# Patient Record
Sex: Female | Born: 1990 | Race: White | Hispanic: No | Marital: Married | Smoking: Never smoker
Health system: Southern US, Community
[De-identification: ages and names within clinical notes are randomized; demographics above are authoritative.]

## PROBLEM LIST (undated history)

## (undated) ENCOUNTER — Inpatient Hospital Stay: Payer: Self-pay

## (undated) DIAGNOSIS — Z862 Personal history of diseases of the blood and blood-forming organs and certain disorders involving the immune mechanism: Secondary | ICD-10-CM

## (undated) DIAGNOSIS — Z8719 Personal history of other diseases of the digestive system: Secondary | ICD-10-CM

## (undated) DIAGNOSIS — O26899 Other specified pregnancy related conditions, unspecified trimester: Secondary | ICD-10-CM

## (undated) DIAGNOSIS — O2212 Genital varices in pregnancy, second trimester: Secondary | ICD-10-CM

## (undated) DIAGNOSIS — Z3490 Encounter for supervision of normal pregnancy, unspecified, unspecified trimester: Secondary | ICD-10-CM

## (undated) DIAGNOSIS — K589 Irritable bowel syndrome without diarrhea: Secondary | ICD-10-CM

## (undated) DIAGNOSIS — Z8619 Personal history of other infectious and parasitic diseases: Secondary | ICD-10-CM

## (undated) DIAGNOSIS — R110 Nausea: Secondary | ICD-10-CM

## (undated) HISTORY — DX: Irritable bowel syndrome, unspecified: K58.9

## (undated) HISTORY — PX: TONSILLECTOMY: SUR1361

## (undated) HISTORY — DX: Personal history of other diseases of the digestive system: Z87.19

## (undated) HISTORY — DX: Encounter for supervision of normal pregnancy, unspecified, unspecified trimester: Z34.90

## (undated) HISTORY — DX: Other specified pregnancy related conditions, unspecified trimester: O26.899

## (undated) HISTORY — DX: Personal history of other infectious and parasitic diseases: Z86.19

## (undated) HISTORY — PX: LAPAROSCOPY: SHX197

## (undated) HISTORY — DX: Genital varices in pregnancy, second trimester: O22.12

## (undated) HISTORY — DX: Personal history of diseases of the blood and blood-forming organs and certain disorders involving the immune mechanism: Z86.2

## (undated) HISTORY — DX: Nausea: R11.0

---
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
## 2010-02-08 ENCOUNTER — Other Ambulatory Visit: Payer: Self-pay

## 2010-02-13 ENCOUNTER — Emergency Department: Payer: Self-pay | Admitting: Emergency Medicine

## 2010-07-15 ENCOUNTER — Emergency Department: Payer: Self-pay | Admitting: Emergency Medicine

## 2010-08-01 ENCOUNTER — Observation Stay: Payer: Self-pay

## 2010-09-08 ENCOUNTER — Observation Stay: Payer: Self-pay

## 2010-09-21 ENCOUNTER — Observation Stay: Payer: Self-pay

## 2010-09-22 ENCOUNTER — Inpatient Hospital Stay: Payer: Self-pay | Admitting: Obstetrics & Gynecology

## 2011-02-16 IMAGING — US US OB < 14 WEEKS - US OB TV
1 series · 17 of 28 positions shown · non-contrast
Comparison: none

REASON FOR EXAM: 1ST TRIMESTER VAG BLEED
COMMENTS:

PROCEDURE:     US  - US OB LESS THAN 14 WEEKS/W TRANS  - February 14, 2010  [DATE]
RESULT:
TECHNIQUE: Transabdominal and endovaginal emergent early OB ultrasound is
performed. Grayscale and color Doppler images were obtained.
There is no previous study for comparison.

[Series 1: us ob < 14 weeks - us ob tv · 17 of 77 slices shown]
[im 1/77]
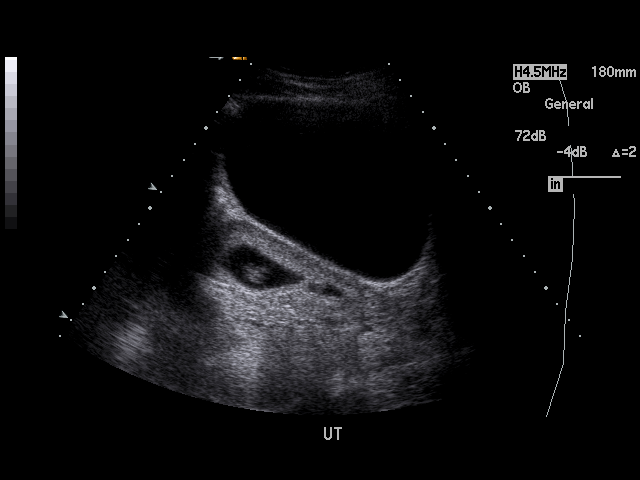
[im 6/77]
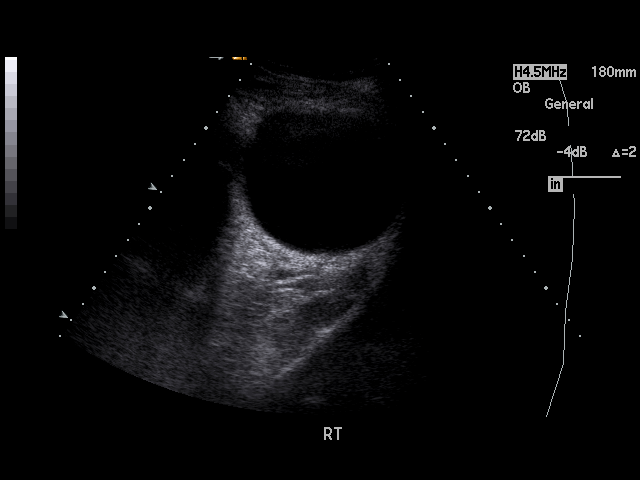
[im 12/77]
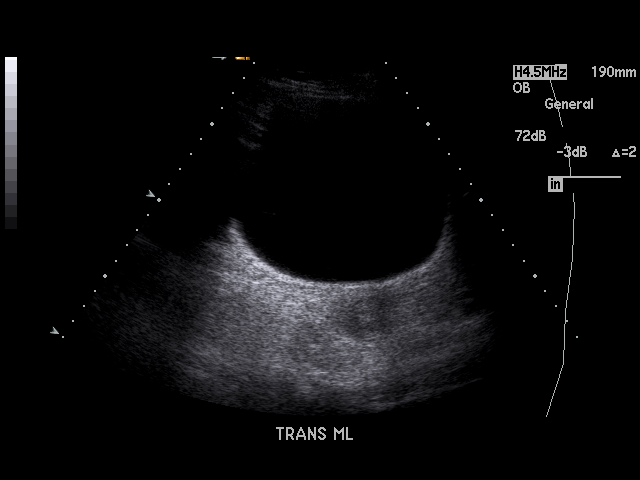
[im 15/77]
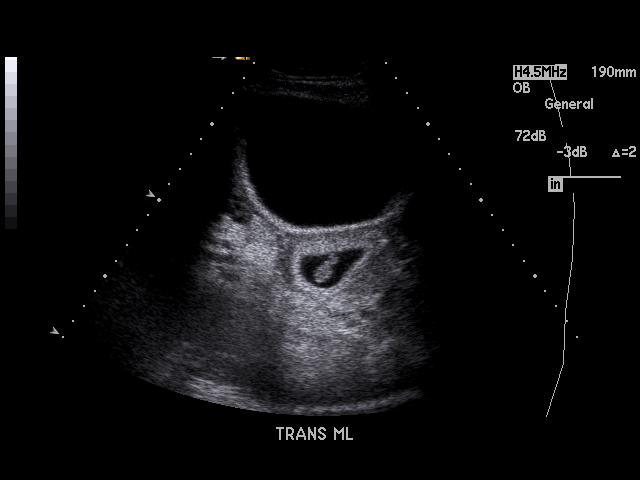
[im 20/77]
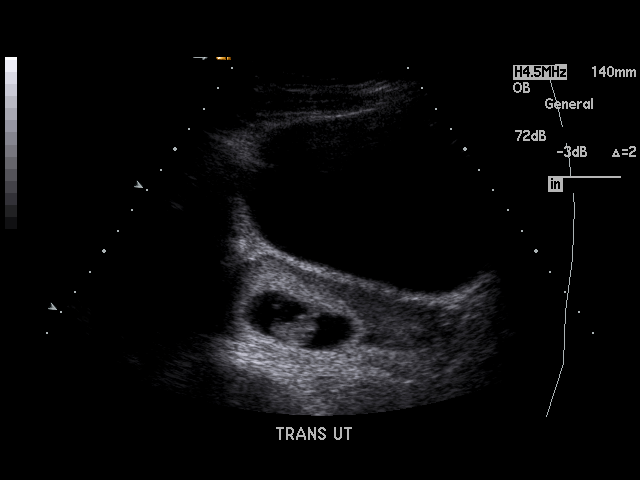
[im 26/77]
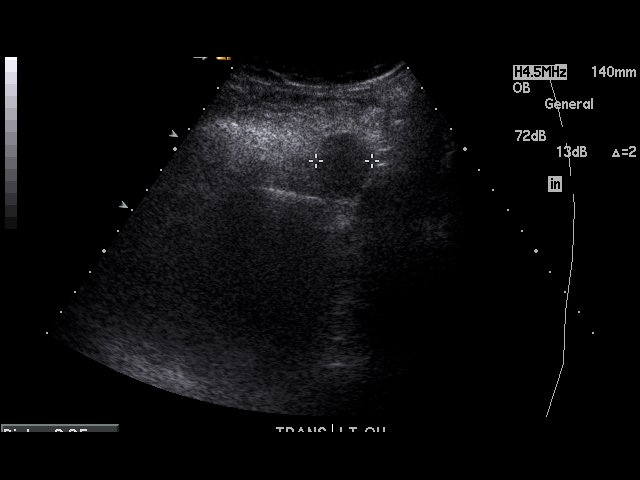
[im 29/77]
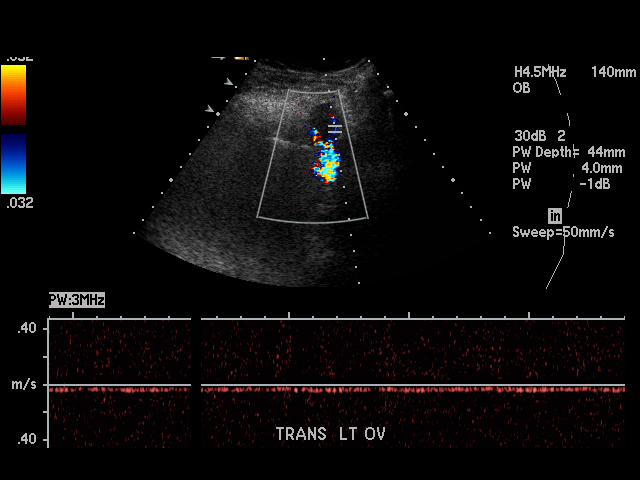
[im 34/77]
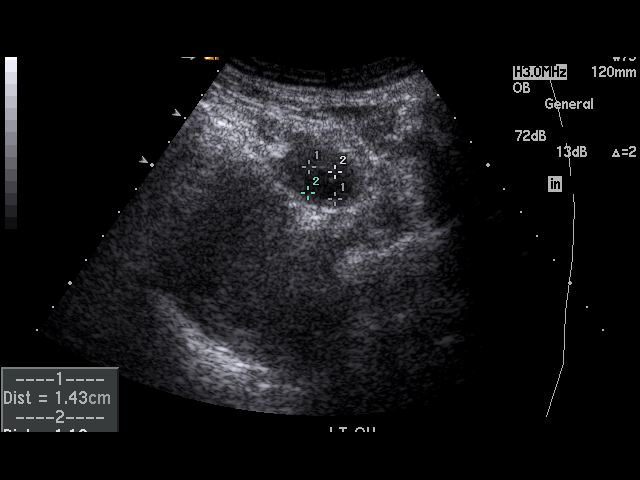
[im 40/77]
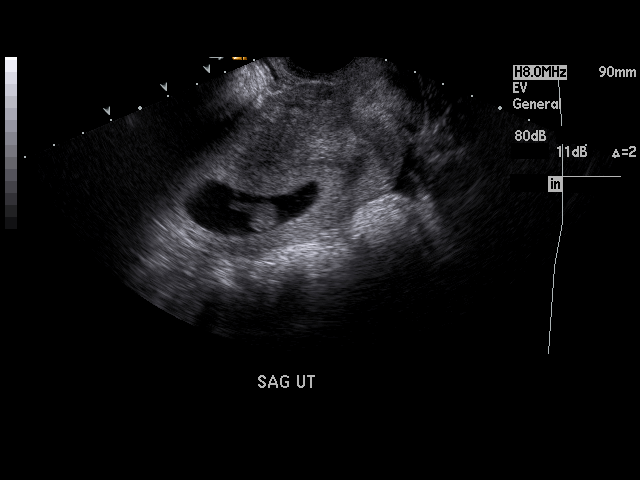
[im 43/77]
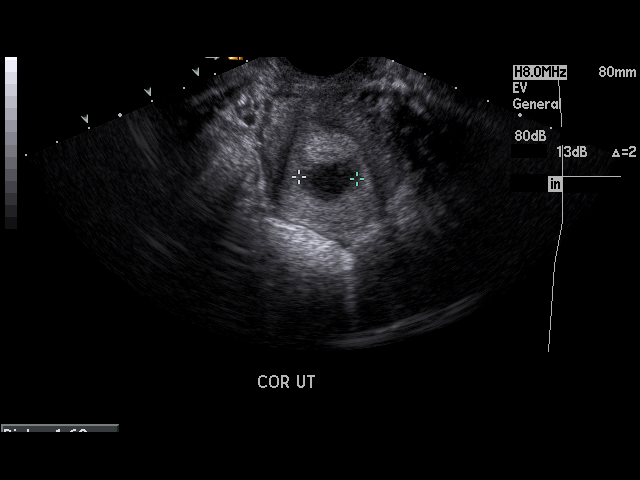
[im 48/77]
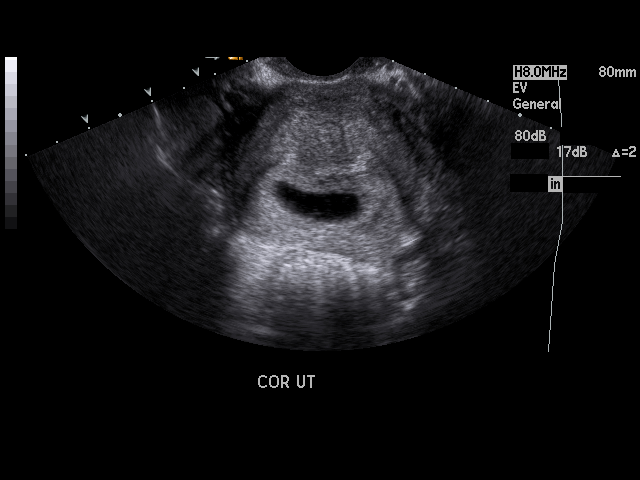
[im 51/77]
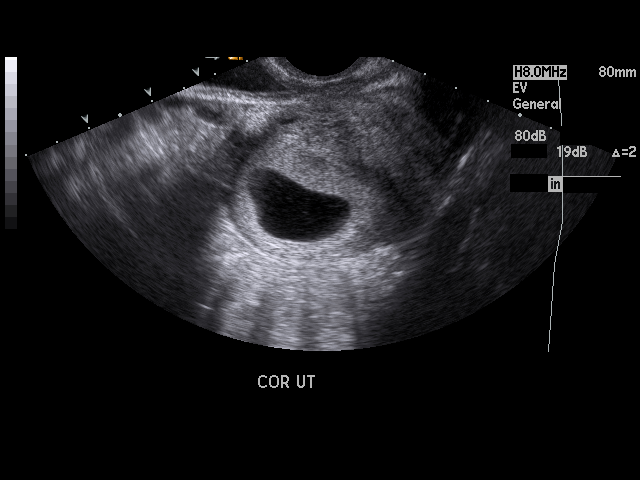
[im 57/77]
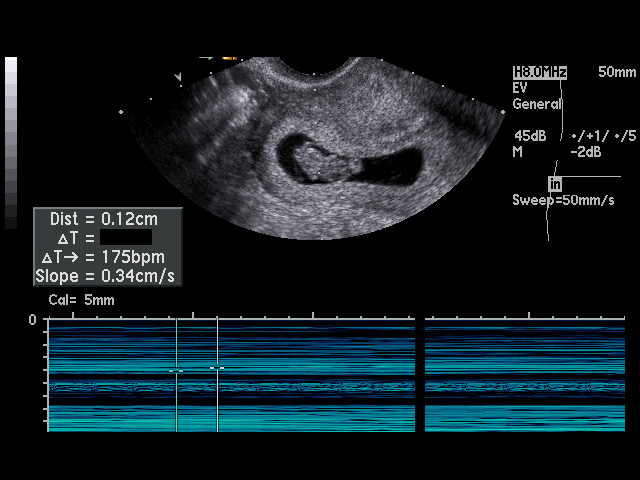
[im 62/77]
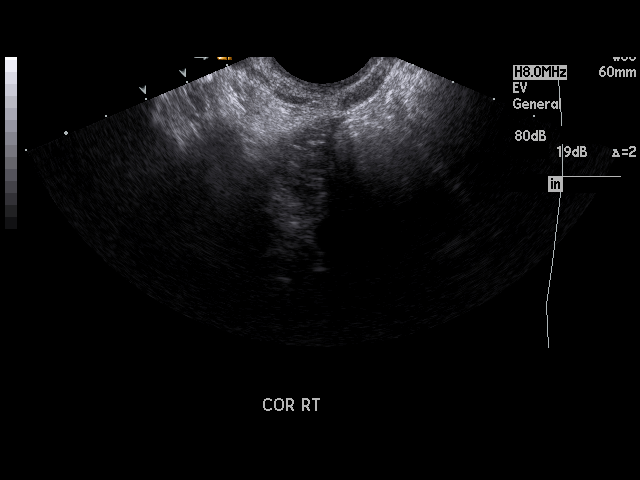
[im 65/77]
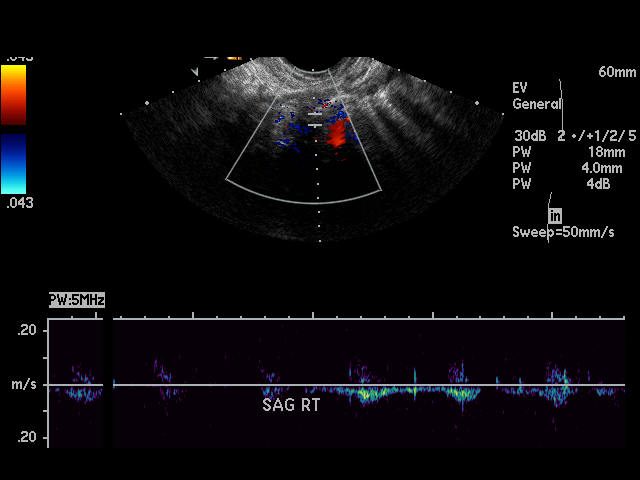
[im 71/77]
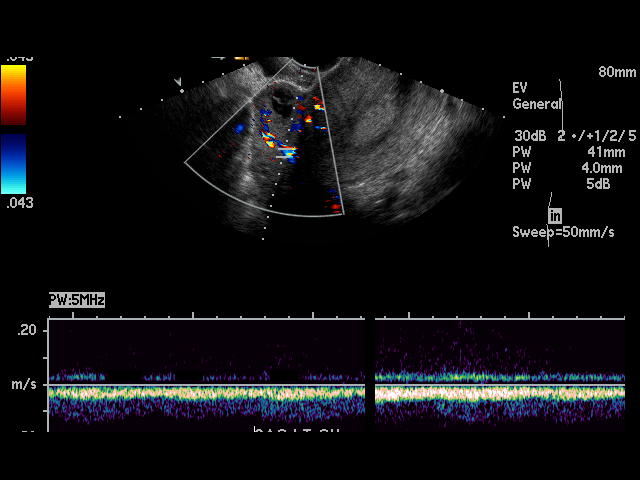
[im 77/77]
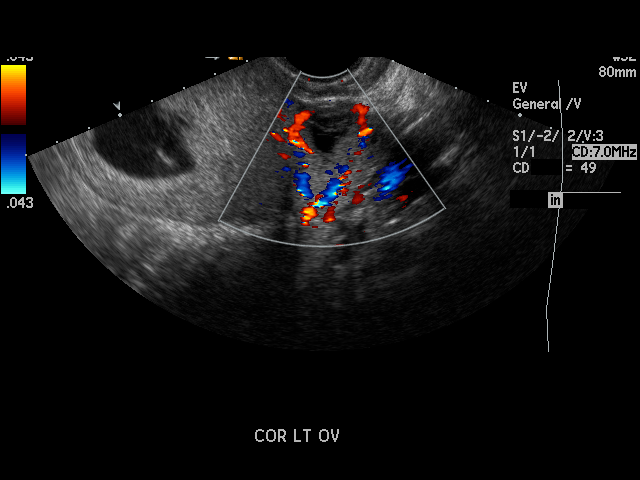

[17 of 28 positions shown; findings below may reference images not displayed]

FINDINGS: An intrauterine gestational sac is present with a single fetal
pole identified demonstrating an average crown-rump length of 1.9 mm which
is consistent with an 8 week, 2 day gestation. Fetal cardiac activity is
demonstrated and measured at 175 beats per minute. A yolk sac is
demonstrated. There is a focal hypodensity consistent with a subchorionic
hemorrhage present measuring approximately 1.82 x 1.69 x 0.61 cm. Both
ovaries are seen. The left ovary appears to contain a corpus luteum cyst
measuring up to 1.4 cm in diameter. Color Doppler evidence of blood flow is
noted in both kidneys. The maternal kidneys appear grossly normal.
IMPRESSION: 8 week, 2 day gestation + / - 5 days with an ultrasound
estimated EDD of 09/24/2010. Subchorionic hemorrhage is present. Clinical
follow-up and routine ultrasound follow-up is recommended unless the
patient's clinical condition dictates otherwise.

## 2011-07-18 IMAGING — CR DG CHEST 1V
1 series · 1 of 1 positions shown · non-contrast
Comparison: none

REASON FOR EXAM: pregnant patient with chest and back pain
COMMENTS:

PROCEDURE:     DXR - DXR CHEST 1 VIEWAP OR PA  - July 16, 2010  [DATE]
RESULT:     The lung fields are clear. No pneumonia, pneumothorax or pleural
effusion is seen. Heart size is normal. The mediastinal and osseous
structures show no significant abnormalities.

[view not recorded]
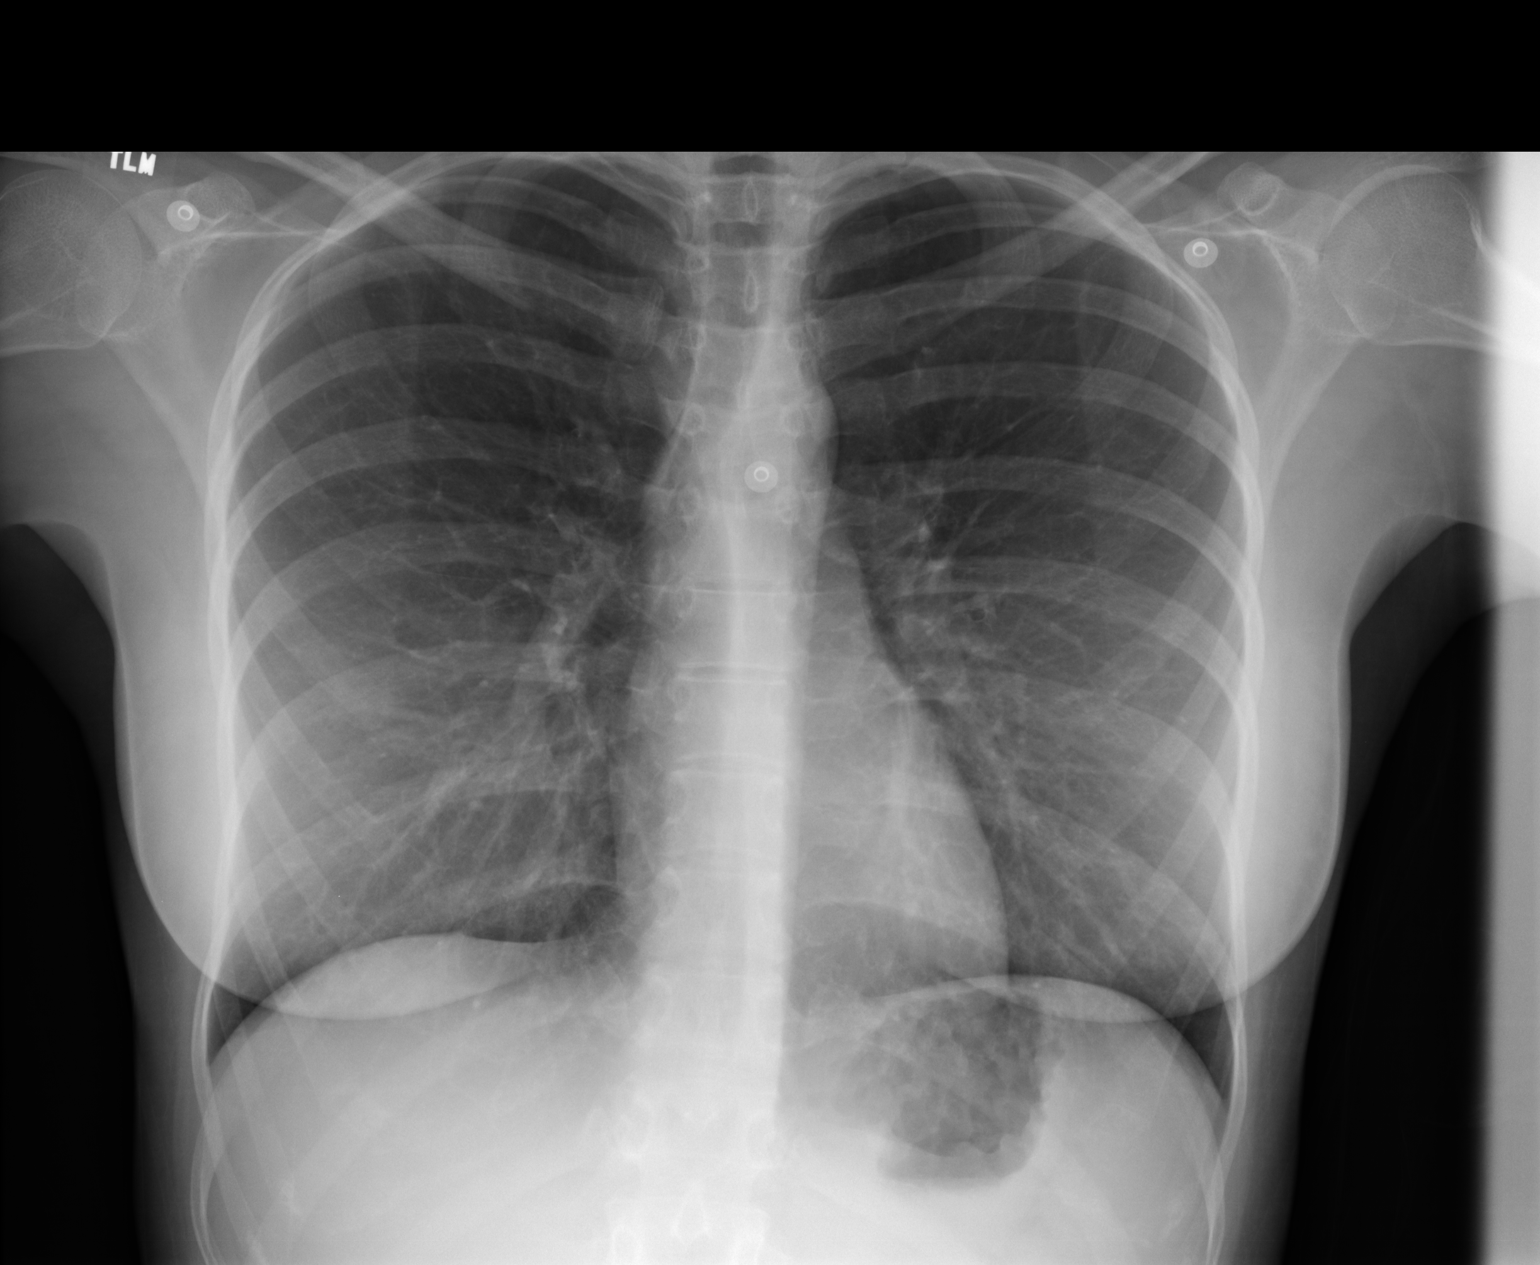

[1 of 1 positions shown; findings below may reference images not displayed]

IMPRESSION: 1.     No acute changes are identified.

## 2015-02-08 NOTE — L&D Delivery Note (Signed)
Delivery Note At 0028  a viable and healthy female "Joann Fitzpatrick" was delivered via  (Presentation:LOA ;  ).  APGAR: 9,9 ; weight  .   Placenta status:delivered intact with 3 vessel  Cord:  with the following complications:none .    Anesthesia:  none Episiotomy:  none Lacerations:  1st degree Suture Repair: 3.0 vicryl rapide Est. Blood Loss (mL):  300  Mom to postpartum.  Baby to Couplet care / Skin to Skin.  Melody NIKE Shambley, CNM 10/05/2015, 12:48 AM  I have reviewed the record and concur with patient management and plan. DEFRANCESCO, Daphine DeutscherMARTIN, MD, Evern CoreFACOG

## 2015-03-15 ENCOUNTER — Emergency Department: Payer: PRIVATE HEALTH INSURANCE

## 2015-03-15 ENCOUNTER — Encounter: Payer: Self-pay | Admitting: Emergency Medicine

## 2015-03-15 ENCOUNTER — Emergency Department
Admission: EM | Admit: 2015-03-15 | Discharge: 2015-03-15 | Disposition: A | Discharge status: Home / Self Care | Payer: PRIVATE HEALTH INSURANCE | Attending: Emergency Medicine | Admitting: Emergency Medicine

## 2015-03-15 DIAGNOSIS — O99511 Diseases of the respiratory system complicating pregnancy, first trimester: Secondary | ICD-10-CM | POA: Diagnosis not present

## 2015-03-15 DIAGNOSIS — J45901 Unspecified asthma with (acute) exacerbation: Secondary | ICD-10-CM | POA: Diagnosis not present

## 2015-03-15 DIAGNOSIS — Z3A10 10 weeks gestation of pregnancy: Secondary | ICD-10-CM | POA: Insufficient documentation

## 2015-03-15 DIAGNOSIS — R0602 Shortness of breath: Secondary | ICD-10-CM

## 2015-03-15 DIAGNOSIS — O9989 Other specified diseases and conditions complicating pregnancy, childbirth and the puerperium: Secondary | ICD-10-CM | POA: Diagnosis present

## 2015-03-15 DIAGNOSIS — R5383 Other fatigue: Secondary | ICD-10-CM | POA: Insufficient documentation

## 2015-03-15 LAB — COMPREHENSIVE METABOLIC PANEL
ALT: 10 U/L — ABNORMAL LOW (ref 14–54)
AST: 19 U/L (ref 15–41)
Albumin: 4.3 g/dL (ref 3.5–5.0)
Alkaline Phosphatase: 33 U/L — ABNORMAL LOW (ref 38–126)
Anion gap: 9 (ref 5–15)
BUN: 10 mg/dL (ref 6–20)
CHLORIDE: 105 mmol/L (ref 101–111)
CO2: 20 mmol/L — ABNORMAL LOW (ref 22–32)
Calcium: 9.2 mg/dL (ref 8.9–10.3)
Creatinine, Ser: <0.3 mg/dL — ABNORMAL LOW (ref 0.44–1.00)
Glucose, Bld: 127 mg/dL — ABNORMAL HIGH (ref 65–99)
Potassium: 3.3 mmol/L — ABNORMAL LOW (ref 3.5–5.1)
Sodium: 134 mmol/L — ABNORMAL LOW (ref 135–145)
Total Bilirubin: 0.9 mg/dL (ref 0.3–1.2)
Total Protein: 7.8 g/dL (ref 6.5–8.1)

## 2015-03-15 LAB — FIBRIN DERIVATIVES D-DIMER (ARMC ONLY): Fibrin derivatives D-dimer (ARMC): 497 (ref 0–499)

## 2015-03-15 LAB — CBC
HCT: 38.5 % (ref 35.0–47.0)
Hemoglobin: 13.5 g/dL (ref 12.0–16.0)
MCH: 30.5 pg (ref 26.0–34.0)
MCHC: 35 g/dL (ref 32.0–36.0)
MCV: 87.3 fL (ref 80.0–100.0)
PLATELETS: 269 10*3/uL (ref 150–440)
RBC: 4.42 MIL/uL (ref 3.80–5.20)
RDW: 12.1 % (ref 11.5–14.5)
WBC: 10.3 10*3/uL (ref 3.6–11.0)

## 2015-03-15 LAB — TROPONIN I

## 2015-03-15 MED ORDER — IPRATROPIUM-ALBUTEROL 0.5-2.5 (3) MG/3ML IN SOLN
3.0000 mL | Freq: Once | RESPIRATORY_TRACT | Status: AC
  Administered 2015-03-15: 3 mL via RESPIRATORY_TRACT
  Filled 2015-03-15: qty 3

## 2015-03-15 NOTE — ED Notes (Signed)
Pt reports she is [redacted] weeks pregnant with complaints of shortness of breath for about 2 weeks reports last night shortness of breath progressed, feels she needs to rest frequesntly OBGYN recommended pt to go to ER. Pt talks in complete sentences no respiratory distress noted.

## 2015-03-15 NOTE — ED Notes (Addendum)
Pt went to Urgent Care this morning and was referred to ED because urgent care said she needed blood test results faster than they could provide in case she needed a blood transfusion. Hx anemia.

## 2015-03-15 NOTE — ED Provider Notes (Signed)
  Physical Exam  BP 106/72 mmHg  Pulse 88  Temp(Src) 97.5 F (36.4 C) (Oral)  Resp 15  Ht  (1.753 m)  Wt 143 lb (64.864 kg)  BMI 21.11 kg/m2  SpO2 100%  Physical Exam  ED Course  Procedures  MDM Care assumed at sign out from Dr. Cyril Loosen. Patient is [redacted] weeks pregnant and had shortness of breath worsening for 2 weeks and worse last night. Sign out pending D-dimer. Patient's d-dimer neg. Labs unremarkable. CXR clear. Given a neb but felt about the same. On my exam, lungs clear. Occasionally needed to take deep breath when talking. I don't know why she is short of breath. She is not tachycardic or hypotensive or hypoxic. She is well appearing otherwise. No signs of cardiomyopathy. She has no chest pain. There is still a small chance for PE despite neg D-dimer so I discussed with her risks and benefits of CT and she decided to watch and wait and observe her symptoms. If shortness of breath worsens, then she needs to return and likely need CT.   Richardean Canal, MD 03/15/15 [phone]

## 2015-03-15 NOTE — Discharge Instructions (Signed)
Take deep breaths.   Stay hydrated.   See your OB doctor.   Return to ER if you have worsening shortness of breath, chest pain, palpitations, trouble breathing, vaginal bleeding, abdominal pain, vomiting.

## 2015-03-15 NOTE — ED Provider Notes (Signed)
[facility] Emergency Department Provider Note  ____________________________________________    I have reviewed the triage vital signs and the nursing notes.   HISTORY  Chief Complaint Shortness of Breath    HPI Joann Fitzpatrick is a 25 y.o. female who presents with complaints of shortness of breath for approximately 2 weeks. Patient reports she is [redacted] weeks pregnant. She notes over the last 2 weeks she has had difficulty catching her breath. Is not affected by position. She becomes winded during conversations. She denies chest pain. No pleurisy. No recent travel. No diaphoresis. No fevers or chills. No cough.No history of blood clots. Prior smoker. History of exercise-induced asthma     History reviewed. No pertinent past medical history.  There are no active problems to display for this patient.   History reviewed. No pertinent past surgical history.  No current outpatient prescriptions on file.  Allergies Review of patient's allergies indicates not on file.  No family history on file.  Social History Social History  Substance Use Topics  . Smoking status: Never Smoker   . Smokeless tobacco: None  . Alcohol Use: No    Review of Systems  Constitutional: Negative for fever. Positive for fatigue Eyes: Negative for blurry vision ENT: Negative for throat swelling or pain Cardiovascular: Negative for chest pain. Respiratory: As above Gastrointestinal: Negative for abdominal pain,  Genitourinary: Negative for dysuria. Musculoskeletal: Negative for back pain. Skin: Negative for rash. Neurological: Negative for headaches Psychiatric: No anxiety    ____________________________________________   PHYSICAL EXAM:  VITAL SIGNS: ED Triage Vitals  Enc Vitals Group     BP 03/15/15 1307 124/85 mmHg     Pulse Rate 03/15/15 1307 91     Resp 03/15/15 1307 15     Temp 03/15/15 1307 97.5 F (36.4 C)     Temp Source 03/15/15 1307 Oral     SpO2  03/15/15 1307 100 %     Weight 03/15/15 1307 143 lb (64.864 kg)     Height 03/15/15 1307  (1.753 m)     Head Cir --      Peak Flow --      Pain Score --      Pain Loc --      Pain Edu? --      Excl. in GC? --      Constitutional: Alert and oriented. Well appearing and in no distress. Eyes: Conjunctivae are normal.  ENT   Head: Normocephalic and atraumatic.   Mouth/Throat: Mucous membranes are moist. Cardiovascular: Normal rate, regular rhythm. Normal and symmetric distal pulses are present in all extremities. No murmurs, rubs, or gallops. Respiratory: Normal respiratory effort without tachypnea nor retractions. Breath sounds are clear and equal bilaterally.  Gastrointestinal: Soft and non-tender in all quadrants. No distention. There is no CVA tenderness. Genitourinary: deferred Musculoskeletal: Nontender with normal range of motion in all extremities. No lower extremity tenderness nor edema. Neurologic:  Normal speech and language. No gross focal neurologic deficits are appreciated. Skin:  Skin is warm, dry and intact. No rash noted. Psychiatric: Mood and affect are normal. Patient exhibits appropriate insight and judgment.  ____________________________________________    LABS (pertinent positives/negatives)  Labs Reviewed  COMPREHENSIVE METABOLIC PANEL - Abnormal; Notable for the following:    Sodium 134 (*)    Potassium 3.3 (*)    CO2 20 (*)    Glucose, Bld 127 (*)    Creatinine, Ser <0.30 (*)    ALT 10 (*)    Alkaline  Phosphatase 33 (*)    All other components within normal limits  CBC  TROPONIN I    ____________________________________________   EKG  ED ECG REPORT I, Jene Every, the attending physician, personally viewed and interpreted this ECG.  Date: 03/15/2015 EKG Time: 1:24 PM Rate: 102 Rhythm: normal sinus rhythm QRS Axis: normal Intervals: normal ST/T Wave abnormalities: normal Conduction Disturbances: none Narrative  Interpretation: unremarkable   ____________________________________________    RADIOLOGY I have personally reviewed any xrays that were ordered on this patient: Chest x-ray unremarkable  ____________________________________________   PROCEDURES  Procedure(s) performed: none  Critical Care performed: none  ____________________________________________   INITIAL IMPRESSION / ASSESSMENT AND PLAN / ED COURSE  Pertinent labs & imaging results that were available during my care of the patient were reviewed by me and considered in my medical decision making (see chart for details).  Patient who is [redacted] weeks pregnant presents with complaints of shortness of breath. This is manifested by a sense that she has to take a very deep breath and that she is not getting as much air as she needs. This is visible during conversations where she pauses to take a breath. Her initial EKG shows sinus tachycardia and an inverted T-wave in lead 3 but is otherwise unremarkable. She has no pain, pleurisy and has not traveled recently. She has never had a blood clot. Apparently she did have exercise-induced asthma in the past but not since she was a teenager.  Differential diagnosis includes possible bronchospasm (although her lung exam is relatively normal) and pulmonary embolism. Do not think that her pregnancy is far enough along to impede her lung expansion or impact or breathing.  I discussed with her extensively our options and we agreed to try a DuoNeb to see if that improves her breathing despite the fact that it is category C and a d-dimer and if the d-dimer is elevated we will do a CT scan of her chest, I did discuss with her radiation risks and she understands completely.  At this time I will sign out to Joann Fitzpatrick who will follow-up the d-dimer.  ____________________________________________   FINAL CLINICAL IMPRESSION(S) / ED DIAGNOSES  Shortness of breath  Jene Every, MD 03/15/15 1510

## 2015-03-17 DIAGNOSIS — R0602 Shortness of breath: Secondary | ICD-10-CM | POA: Insufficient documentation

## 2015-03-20 ENCOUNTER — Encounter: Payer: Self-pay | Admitting: Cardiovascular Disease

## 2015-03-23 ENCOUNTER — Encounter: Payer: Self-pay | Admitting: Cardiovascular Disease

## 2015-03-23 ENCOUNTER — Ambulatory Visit (INDEPENDENT_AMBULATORY_CARE_PROVIDER_SITE_OTHER): Payer: PRIVATE HEALTH INSURANCE | Admitting: Cardiovascular Disease

## 2015-03-23 VITALS — BP 106/70 | HR 78 | Ht 69.0 in | Wt 137.8 lb

## 2015-03-23 DIAGNOSIS — R42 Dizziness and giddiness: Secondary | ICD-10-CM | POA: Diagnosis not present

## 2015-03-23 NOTE — Patient Instructions (Addendum)
Increase your intake of fluids (water with electrolyte tabs like Nun tablets, or gatorade) , protein ( hard boiled eggs, chicken, fish) , and a electrolytes ( V-8 juice, salt, potassium chloride  which is sold as No-Salt  Medication Instructions:  Your physician recommends that you continue on your current medications as directed. Please refer to the Current Medication list given to you today.   Labwork: None Ordered   Testing/Procedures: None Ordered   Follow-Up: Your physician recommends that you schedule a follow-up appointment in: as needed with Dr. Elease Hashimoto.    If you need a refill on your cardiac medications before your next appointment, please call your pharmacy.   Thank you for choosing [facility]! Eligha Bridegroom, RN [phone]

## 2015-03-23 NOTE — Progress Notes (Signed)
Cardiology Office Note   Date:  03/23/2015   ID:  [name], DOB [birth year], MRN [id number]  PCP:  Lenoard Aden, MD  Cardiologist:   Vesta Mixer, MD   Chief Complaint  Patient presents with  . Dizziness   Problem List  1. Dizziness.    History of Present Illness: AINSLEE Fitzpatrick is a 25 y.o. female who presents forshortness of breath / She is [redacted] weeks pregnant.  Has had some increased shortness of breath.   Associated with dizziness and lightheadedness.  Went to the urgent care.  was sent to [facility].  Had a CXR, - (normal ) , d-dimer was normal  Echo (at [facility]) was normal  ( available on Care everywhere)  Dr. Rosemary Holms  Referred  her here.    Was been present 2 week.  Has near syncope , worse in the AM,   Not eating as regularly due to nausea - has lost 13 lbs so far during her pregnancy .    Is now forcing herself to eat breakfast.    Still has lots of dizziness.   This is her 2nd pregnancy .  Lives in [place] ([employer])  Lives in [place] .     Past Medical History  Diagnosis Date  . History of IBS   . Hx of iron deficiency anemia   . History of chicken pox   . Pregnancy with uncertain dates   . Pregnancy related nausea, antepartum     Past Surgical History  Procedure Laterality Date  . Tonsillectomy    . Laparoscopy       Current Outpatient Prescriptions  Medication Sig Dispense Refill  . folic acid (FOLVITE) 1 MG tablet Take 1 mg by mouth daily.    . Pediatric Multivit-Minerals-C (FLINTSTONES GUMMIES PLUS PO) Take 1 each by mouth daily.     No current facility-administered medications for this visit.    Allergies:   Oxycodone-acetaminophen    Social History:  The patient  reports that she has never smoked. She does not have any smokeless tobacco history on file. She reports that she does not drink alcohol or use illicit drugs.   Family History:  The patient's family history includes Asthma in her other;  Emphysema in her other; Renal cancer in her other; Thyroid nodules in her other.    ROS:  Please see the history of present illness.    Review of Systems: Constitutional:  denies fever, chills, diaphoresis, appetite change and fatigue.  HEENT: denies photophobia, eye pain, redness, hearing loss, ear pain, congestion, sore throat, rhinorrhea, sneezing, neck pain, neck stiffness and tinnitus.  Respiratory: denies SOB, DOE, cough, chest tightness, and wheezing.  Cardiovascular: denies chest pain, palpitations and leg swelling.  Gastrointestinal: admits to nausea, vomiting,    Genitourinary: denies dysuria, urgency, frequency, hematuria, flank pain and difficulty urinating.  Musculoskeletal: denies  myalgias, back pain, joint swelling, arthralgias and gait problem.   Skin: denies pallor, rash and wound.  Neurological: denies dizziness, seizures, syncope, weakness, light-headedness, numbness and headaches.   Hematological: denies adenopathy, easy bruising, personal or family bleeding history.  Psychiatric/ Behavioral: denies suicidal ideation, mood changes, confusion, nervousness, sleep disturbance and agitation.       All other systems are reviewed and negative.    PHYSICAL EXAM: VS:  BP 106/70 mmHg  Pulse 78  Ht  (1.753 m)  Wt 137 lb 12.8 oz (62.506 kg)  BMI 20.34 kg/m2 , BMI Body mass index  is 20.34 kg/(m^2). GEN: Well nourished, well developed, in no acute distress HEENT: normal Neck: no JVD, carotid bruits, or masses Cardiac: RRR; no murmurs, rubs, or gallops,no edema  Respiratory:  clear to auscultation bilaterally, normal work of breathing GI: soft, nontender, nondistended, + BS MS: no deformity or atrophy Skin: warm and dry, no rash Neuro:  Strength and sensation are intact Psych: normal   EKG:  EKG is ordered today. The ekg ordered today demonstrates  NSR at 78.  No ST or T wave changes.    Recent Labs: 03/15/2015: ALT 10*; BUN 10; Creatinine, Ser <0.30*;  Hemoglobin 13.5; Platelets 269; Potassium 3.3*; Sodium 134*    Lipid Panel No results found for: CHOL, TRIG, HDL, CHOLHDL, VLDL, LDLCALC, LDLDIRECT    Wt Readings from Last 3 Encounters:  03/23/15 137 lb 12.8 oz (62.506 kg)  03/15/15 143 lb (64.864 kg)      Other studies Reviewed: Additional studies/ records that were reviewed today include: . Review of the above records demonstrates:    ASSESSMENT AND PLAN:  1.  Caryn presents with episodes of dizziness.  She has lost 13 lbs since her pregnancy started.   I suspect this is contributing to her symptoms - I suspect she is volume depleted.   I have recommended that she increase her intake of fluids, electrolytes, and protein. I've suggested that she drank a V8 juice every day. She should eat and drink multiple times through the day to maintain hydration.   She has had an echocardiogram that was normal. Her workup has otherwise been normal. The symptoms occur at predictable Maisie Fus of the day-first thing in the morning and late in the afternoon. This does not sound like an arrhythmia but sounds more like volume depletion.  Because of this pattern, I do not think that she needs a heart monitor.  We can always place a monitor if she starts having other syptoms.    Current medicines are reviewed at length with the patient today.  The patient does not have concerns regarding medicines.  The following changes have been made:  no change  Labs/ tests ordered today include:  No orders of the defined types were placed in this encounter.     Disposition:   FU with me as needed.       Chiron Campione, Deloris Ping, MD  03/23/2015 9:32 AM    [facility] [address] [place], [place], [place]  [address] Phone: [phone]; Fax: [phone]   [facility]  [address] [place], [place]  [address] [phone]   Fax [phone]

## 2015-08-05 ENCOUNTER — Encounter: Payer: Self-pay | Admitting: Obstetrics and Gynecology

## 2015-08-05 ENCOUNTER — Ambulatory Visit (INDEPENDENT_AMBULATORY_CARE_PROVIDER_SITE_OTHER): Payer: Medicaid Other | Admitting: Obstetrics and Gynecology

## 2015-08-05 ENCOUNTER — Telehealth: Payer: Self-pay | Admitting: Obstetrics and Gynecology

## 2015-08-05 VITALS — BP 110/63 | HR 93 | Wt 163.8 lb

## 2015-08-05 DIAGNOSIS — Z331 Pregnant state, incidental: Secondary | ICD-10-CM

## 2015-08-05 DIAGNOSIS — Z3493 Encounter for supervision of normal pregnancy, unspecified, third trimester: Secondary | ICD-10-CM

## 2015-08-05 DIAGNOSIS — I863 Vulval varices: Secondary | ICD-10-CM

## 2015-08-05 DIAGNOSIS — Z3490 Encounter for supervision of normal pregnancy, unspecified, unspecified trimester: Secondary | ICD-10-CM

## 2015-08-05 NOTE — Progress Notes (Signed)
Transfer OB-out of work since 19 weeks due to vaginal varicosities;desires PPTL- discussed and medicaid tubal papers signed today. Instructed on using witch hazel saturated pads prn to varicosities. Had growth scan last week due to low FH- EFW 65% and AFI normal. Discussed previous delivery and precipitous nature with multiple ruptured varicosities and bleeding/pain.

## 2015-08-05 NOTE — Progress Notes (Signed)
NOB- transfer from [facility]- pt states her baby is very low, lots of vaginal pressure, lots of back pain

## 2015-08-05 NOTE — Telephone Encounter (Signed)
Notified pt. 

## 2015-08-05 NOTE — Patient Instructions (Signed)
Postpartum Tubal Ligation Postpartum tubal ligation (PPTL) is a procedure that closes the fallopian tubes right after childbirth or 1-2 days after childbirth. PPTL is done before the uterus returns to its normal location. The procedure is also called a mini-laparotomy. When the fallopian tubes are closed, the eggs that are released from the ovaries cannot enter the uterus, and sperm cannot reach the egg. PPTL is done so you will not be able to get pregnant or have a baby. Although this procedure may be undone (reversed), it should be considered permanent and irreversible. If you want to have future pregnancies, you should not have this procedure. LET YOUR HEALTH CARE PROVIDER KNOW ABOUT:  Any allergies you have.  All medicines you are taking, including vitamins, herbs, eye drops, creams, and over-the-counter medicines. This includes any use of steroids, either by mouth or in cream form.  Previous problems you or members of your family have had with the use of anesthetics.  Any blood disorders you have.  Previous surgeries you have had.  Any medical conditions you may have. RISKS AND COMPLICATIONS  Infection.  Bleeding.  Injury to surrounding organs.  Side effects from anesthetics.  Failure of the procedure.  Ectopic pregnancy.  Future regret about having the procedure done. BEFORE THE PROCEDURE  You may need to sign certain documents, including an informed consent form, up to 30 days before the date of your tubal ligation.  Follow instructions from your health care provider about eating and drinking restrictions. PROCEDURE  If done 1-2 days after a vaginal delivery:  You will be given one or more of the following:  A medicine that helps you relax (sedative).  A medicine that numbs the area (local anesthetic).  A medicine that makes you fall asleep (general anesthetic).  A medicine that is injected into an area of your body that numbs everything below the injection site  (regional anesthetic).  If you have been given general anesthetic, a tube will be put down your throat to help you breathe.  Your bladder may be emptied with a small tube (catheter).  A small cut (incision) will be made just above the pubic hair line.  The fallopian tubes will be located and brought up through the incision.  The fallopian tubes will be tied off or burned (cauterized), or they will be closed with a clamp, ring, or clip. In many cases, a small portion in the center of each fallopian tube will also be removed.  The incision will be closed with stitches (sutures).  A bandage (dressing) will be placed over the incision. The procedure may vary among health care providers and hospitals. If done after a cesarean delivery:  Tubal ligation will be done through the incision that was used for the cesarean delivery of your baby.  After the tubes are closed, the incision will be closed with stitches (sutures).  A bandage (dressing) will be placed over the incision. The procedure may vary among health care providers and hospitals. AFTER THE PROCEDURE  Your blood pressure, heart rate, breathing rate, and blood oxygen level will be monitored often until the medicines you were given have worn off.  You will be given pain medicine as needed.  If you had general anesthetic, you may have some mild discomfort in your throat. This is from the breathing tube that was placed in your throat while you were sleeping.  You may feel tired, and you should rest for the remainder of the day.  You may have some pain   or cramps in the abdominal area for 3-7 days.   This information is not intended to replace advice given to you by your health care provider. Make sure you discuss any questions you have with your health care provider.   Document Released: 01/24/2005 Document Revised: 06/10/2014 Document Reviewed: 05/07/2011 Elsevier Interactive Patient Education 2016 [employer]vier [employer].  

## 2015-08-05 NOTE — Telephone Encounter (Signed)
Joann Fitzpatrick told her a certain brand of kotex to buy to make an ice pack. She can't remember the brand.

## 2015-08-18 ENCOUNTER — Telehealth: Payer: Self-pay | Admitting: Obstetrics and Gynecology

## 2015-08-18 ENCOUNTER — Ambulatory Visit (INDEPENDENT_AMBULATORY_CARE_PROVIDER_SITE_OTHER): Payer: Medicaid Other | Admitting: Obstetrics and Gynecology

## 2015-08-18 ENCOUNTER — Encounter: Payer: Medicaid Other | Admitting: Obstetrics and Gynecology

## 2015-08-18 VITALS — BP 115/62 | HR 110 | Wt 167.0 lb

## 2015-08-18 DIAGNOSIS — N949 Unspecified condition associated with female genital organs and menstrual cycle: Secondary | ICD-10-CM | POA: Diagnosis not present

## 2015-08-18 DIAGNOSIS — Z1389 Encounter for screening for other disorder: Secondary | ICD-10-CM

## 2015-08-18 DIAGNOSIS — Z36 Encounter for antenatal screening of mother: Secondary | ICD-10-CM | POA: Diagnosis not present

## 2015-08-18 DIAGNOSIS — R102 Pelvic and perineal pain: Principal | ICD-10-CM

## 2015-08-18 DIAGNOSIS — O26899 Other specified pregnancy related conditions, unspecified trimester: Secondary | ICD-10-CM

## 2015-08-18 DIAGNOSIS — O9989 Other specified diseases and conditions complicating pregnancy, childbirth and the puerperium: Secondary | ICD-10-CM | POA: Diagnosis not present

## 2015-08-18 DIAGNOSIS — Z369 Encounter for antenatal screening, unspecified: Secondary | ICD-10-CM

## 2015-08-18 LAB — POCT URINALYSIS DIPSTICK
BILIRUBIN UA: NEGATIVE
Blood, UA: NEGATIVE
GLUCOSE UA: NEGATIVE
Nitrite, UA: NEGATIVE
Protein, UA: NEGATIVE
Spec Grav, UA: 1.02
UROBILINOGEN UA: NEGATIVE
pH, UA: 6

## 2015-08-18 NOTE — Progress Notes (Signed)
NONSTRESS TEST INTERPRETATION  INDICATIONS:  Contractions/pain  FHR baseline: 150 RESULTS: reactive COMMENTS: no contractions   PLAN:  1. labor precautons 2. Regular OB apointment as scheduled  Fenton Mallingebbie Ridgeway, LPN Herold HarmsMartin A Duard Spiewak, MD    addendum: patient experienced regular contractions last night between 9 PM and 11 PM. Now she Is Noting Constant Pressure in the pelvis. Fetus is active. No vaginal bleeding or discharge. No ruptured membranes. Cervix is closed/30%/soft/-3/vertex/bag of water intact

## 2015-08-18 NOTE — Telephone Encounter (Signed)
Up and doing a lot over weekend. Last night starting having contractions about 10 min apart, lasting about 20 seconds. Pain was across all of abdomen. Abdomen staying super tight and having lots of pressure. Abdomen not letting up with tightness. Cannot stand up straight due to tightness of abdomen. Denies any vaginal bleeding, or leakage of vaginal fluid. Pt to come to office today for NST and then will see MD.

## 2015-08-18 NOTE — Telephone Encounter (Signed)
MNS PATIENT 32 WEEKS, STATED SHE HAS A VERY BUSY WEEKEND, STARTED HAVING WHAT SHE THINKS BRAXTON HOCKS LASTNGIHT EVERY 10 MINUTES, THEY STOPPED ABOUT 1:30 AM, AND TODAY SHE IS HAVING A LOT OF PRESSURE AND TIGHTNESS IN HER PELVIC REGION, BUT NO CONTRACTIONS.

## 2015-08-26 ENCOUNTER — Encounter: Payer: Self-pay | Admitting: Obstetrics and Gynecology

## 2015-08-26 ENCOUNTER — Ambulatory Visit (INDEPENDENT_AMBULATORY_CARE_PROVIDER_SITE_OTHER): Payer: Medicaid Other | Admitting: Obstetrics and Gynecology

## 2015-08-26 VITALS — BP 111/65 | HR 82 | Wt 167.2 lb

## 2015-08-26 DIAGNOSIS — Z3493 Encounter for supervision of normal pregnancy, unspecified, third trimester: Secondary | ICD-10-CM

## 2015-08-26 LAB — POCT URINALYSIS DIPSTICK
Bilirubin, UA: NEGATIVE
Blood, UA: NEGATIVE
Glucose, UA: NEGATIVE
Ketones, UA: NEGATIVE
Nitrite, UA: NEGATIVE
PROTEIN UA: NEGATIVE
SPEC GRAV UA: 1.015
Urobilinogen, UA: 0.2
pH, UA: 7

## 2015-08-26 NOTE — Progress Notes (Signed)
ROB- discussed BTL again- and discussed precipitous labor with first child, changed from 3 to 10 in 30 minutes and delivered- desires pain meds again.

## 2015-08-26 NOTE — Progress Notes (Signed)
ROB- pt is having a lot of pelvic pressure, she is having some contractions 

## 2015-09-01 ENCOUNTER — Telehealth: Payer: Self-pay | Admitting: Obstetrics and Gynecology

## 2015-09-01 NOTE — Telephone Encounter (Signed)
pls advise

## 2015-09-01 NOTE — Telephone Encounter (Signed)
I would have her try tylenol PM, may repeat the dose after 4 hours if she wakes up then,

## 2015-09-01 NOTE — Telephone Encounter (Signed)
Notified pt she voiced understanding 

## 2015-09-08 ENCOUNTER — Ambulatory Visit (INDEPENDENT_AMBULATORY_CARE_PROVIDER_SITE_OTHER): Payer: Medicaid Other | Admitting: Obstetrics and Gynecology

## 2015-09-08 VITALS — BP 106/63 | HR 104 | Wt 172.4 lb

## 2015-09-08 DIAGNOSIS — Z113 Encounter for screening for infections with a predominantly sexual mode of transmission: Secondary | ICD-10-CM

## 2015-09-08 DIAGNOSIS — G479 Sleep disorder, unspecified: Secondary | ICD-10-CM

## 2015-09-08 DIAGNOSIS — Z36 Encounter for antenatal screening of mother: Secondary | ICD-10-CM

## 2015-09-08 DIAGNOSIS — Z3493 Encounter for supervision of normal pregnancy, unspecified, third trimester: Secondary | ICD-10-CM

## 2015-09-08 DIAGNOSIS — Z3685 Encounter for antenatal screening for Streptococcus B: Secondary | ICD-10-CM

## 2015-09-08 LAB — POCT URINALYSIS DIPSTICK
BILIRUBIN UA: NEGATIVE
Bilirubin, UA: NEGATIVE
Blood, UA: NEGATIVE
Glucose, UA: NEGATIVE
Glucose, UA: NEGATIVE
KETONES UA: NEGATIVE
KETONES UA: NEGATIVE
Nitrite, UA: NEGATIVE
Nitrite, UA: NEGATIVE
PH UA: 7.5
PH UA: 7.5
PROTEIN UA: NEGATIVE
Protein, UA: NEGATIVE
RBC UA: NEGATIVE
Spec Grav, UA: <=1.005
Urobilinogen, UA: 0.2
Urobilinogen, UA: 0.2

## 2015-09-08 MED ORDER — ZOLPIDEM TARTRATE 5 MG PO TABS: 5.0000 mg | ORAL_TABLET | Freq: Every evening | ORAL | 1 refills | Status: DC | PRN

## 2015-09-08 NOTE — Progress Notes (Signed)
ROB- cultures obtained, pt is c/o insomnia

## 2015-09-08 NOTE — Progress Notes (Signed)
ROB- varicosities much worse and uncomfortable, and pelvic pressure worse;not sleeping at all. Used ambien in past- will try again. Cultures obtained.

## 2015-09-09 LAB — GC/CHLAMYDIA PROBE AMP
CHLAMYDIA, DNA PROBE: NEGATIVE
NEISSERIA GONORRHOEAE BY PCR: NEGATIVE

## 2015-09-10 LAB — STREP GP B NAA: Strep Gp B NAA: NEGATIVE

## 2015-09-15 ENCOUNTER — Ambulatory Visit (INDEPENDENT_AMBULATORY_CARE_PROVIDER_SITE_OTHER): Payer: Medicaid Other | Admitting: Obstetrics and Gynecology

## 2015-09-15 VITALS — BP 115/80 | HR 103 | Wt 172.3 lb

## 2015-09-15 DIAGNOSIS — Z3493 Encounter for supervision of normal pregnancy, unspecified, third trimester: Secondary | ICD-10-CM

## 2015-09-15 LAB — POCT URINALYSIS DIPSTICK
BILIRUBIN UA: NEGATIVE
GLUCOSE UA: NEGATIVE
KETONES UA: NEGATIVE
Leukocytes, UA: NEGATIVE
Nitrite, UA: NEGATIVE
PH UA: 6.5
RBC UA: NEGATIVE
Spec Grav, UA: 1.015
Urobilinogen, UA: 0.2

## 2015-09-15 NOTE — Progress Notes (Signed)
ROB- pt states when she takes [place] she is able to get some rest, having pelvic pressure, contractions

## 2015-09-15 NOTE — Progress Notes (Signed)
ROB- doing better; will proceed with planned growth scan next week

## 2015-09-22 ENCOUNTER — Ambulatory Visit (INDEPENDENT_AMBULATORY_CARE_PROVIDER_SITE_OTHER): Payer: Medicaid Other

## 2015-09-22 ENCOUNTER — Ambulatory Visit (INDEPENDENT_AMBULATORY_CARE_PROVIDER_SITE_OTHER): Payer: Medicaid Other | Admitting: Obstetrics and Gynecology

## 2015-09-22 VITALS — BP 108/70 | HR 112 | Wt 174.6 lb

## 2015-09-22 DIAGNOSIS — Z3493 Encounter for supervision of normal pregnancy, unspecified, third trimester: Secondary | ICD-10-CM | POA: Diagnosis not present

## 2015-09-22 LAB — POCT URINALYSIS DIPSTICK
Bilirubin, UA: NEGATIVE
Blood, UA: NEGATIVE
GLUCOSE UA: NEGATIVE
Ketones, UA: NEGATIVE
LEUKOCYTES UA: NEGATIVE
NITRITE UA: NEGATIVE
Protein, UA: NEGATIVE
Spec Grav, UA: 1.015
UROBILINOGEN UA: 0.2
pH, UA: 6.5

## 2015-09-22 NOTE — Progress Notes (Signed)
ROB- growth scan today reveals AFI 15cm, vertex and EFW 7#10oz, Fetal breathing motions noted and grade 2 placenta. Indications: Growth and AFI Findings:  Singleton intrauterine pregnancy is visualized with FHR at 147 BPM. Biometrics give an (U/S) Gestational age of [redacted] weeks and 4 days, and an (U/S) EDD of 10/16/15; this correlates with the clinically established EDD of 10/10/15.  Fetal presentation is vertex, spine left lateral.  EFW: 3224 grams ( 7 lbs. 2 oz. ) 54th percentile, Williams Placenta: Anterior, grade 2 AFI: Adequate at 14.8 cm.  Anatomic survey of the fetal stomach, bladder and kidneys appear WNL.  Impression: 1. EFW: 3224 grams ( 7 lbs. 2 oz. ), 54th percentile, Williams 2. AFI: 14.8 cm, Adequate for 37 weeks. Discussed IOL at 39 weeks, will schedule on 8/27

## 2015-09-22 NOTE — Progress Notes (Signed)
ROB-pt is having some contractions, pelvic pressure 

## 2015-09-24 ENCOUNTER — Ambulatory Visit (INDEPENDENT_AMBULATORY_CARE_PROVIDER_SITE_OTHER): Payer: Medicaid Other | Admitting: Obstetrics and Gynecology

## 2015-09-24 VITALS — BP 116/69 | HR 91 | Wt 175.0 lb

## 2015-09-24 DIAGNOSIS — O4393 Unspecified placental disorder, third trimester: Secondary | ICD-10-CM

## 2015-09-24 NOTE — Progress Notes (Signed)
NONSTRESS TEST INTERPRETATION  INDICATIONS: Placenta grade 2, anterior  FHR baseline: 140 RESULTS: reactive   COMMENTS: B/P's during NST: B/P 105/64, P-102; B/P 110/64, P-91; B/P 108/63, P-96   PLAN: 1. Continue fetal kick counts twice a day. 2. Continue antepartum testing as scheduled-Biweekly   Fenton Mallingebbie Hadley Soileau, LPN

## 2015-09-28 ENCOUNTER — Observation Stay
Admission: EM | Admit: 2015-09-28 | Discharge: 2015-09-28 | Disposition: A | Discharge status: Home / Self Care | Payer: Medicaid Other | Attending: Obstetrics and Gynecology | Admitting: Obstetrics and Gynecology

## 2015-09-28 ENCOUNTER — Encounter: Payer: Self-pay | Admitting: *Deleted

## 2015-09-28 DIAGNOSIS — Z3490 Encounter for supervision of normal pregnancy, unspecified, unspecified trimester: Secondary | ICD-10-CM

## 2015-09-28 DIAGNOSIS — O26893 Other specified pregnancy related conditions, third trimester: Principal | ICD-10-CM | POA: Insufficient documentation

## 2015-09-28 DIAGNOSIS — Z3483 Encounter for supervision of other normal pregnancy, third trimester: Secondary | ICD-10-CM

## 2015-09-28 DIAGNOSIS — Z3A39 39 weeks gestation of pregnancy: Secondary | ICD-10-CM | POA: Diagnosis not present

## 2015-09-28 NOTE — OB Triage Note (Signed)
Recvd pt from ED. C/O really bad cramping and is feeling baby move okay. Denies intercourse for past 24 hours. Changed into gown and hooked up to EFM.

## 2015-09-28 NOTE — Discharge Instructions (Signed)
Come back if:  Big gush of fluids Temp over 100.4 Heavy vaginal bleeding Contractions every 3-5 min lasting at least one hour  Get plenty of rest and stay well hydrated!

## 2015-09-29 ENCOUNTER — Ambulatory Visit (INDEPENDENT_AMBULATORY_CARE_PROVIDER_SITE_OTHER): Payer: Medicaid Other | Admitting: Obstetrics and Gynecology

## 2015-09-29 ENCOUNTER — Other Ambulatory Visit: Payer: Medicaid Other

## 2015-09-29 VITALS — BP 106/71 | HR 97 | Wt 176.2 lb

## 2015-09-29 DIAGNOSIS — Z36 Encounter for antenatal screening of mother: Secondary | ICD-10-CM

## 2015-09-29 DIAGNOSIS — Z1389 Encounter for screening for other disorder: Secondary | ICD-10-CM

## 2015-09-29 DIAGNOSIS — Z369 Encounter for antenatal screening, unspecified: Secondary | ICD-10-CM

## 2015-09-29 LAB — POCT URINALYSIS DIPSTICK
Bilirubin, UA: NEGATIVE
Blood, UA: NEGATIVE
Glucose, UA: NEGATIVE
KETONES UA: NEGATIVE
Nitrite, UA: NEGATIVE
PROTEIN UA: NEGATIVE
Spec Grav, UA: 1.01
UROBILINOGEN UA: NEGATIVE
pH, UA: 6

## 2015-09-29 NOTE — Progress Notes (Signed)
ROB & NST- doing well, denies any changes except pressure, will proceed with NST in 3 days and IOL on 8/27 as planned.

## 2015-09-29 NOTE — Progress Notes (Signed)
NONSTRESS TEST INTERPRETATION  INDICATIONS: Placenta grade 2, anterior  FHR baseline: 140 RESULTS: reactive COMMENTS: NST B/P's: 122/68, P-104; B/P 120/69, P-102 Irregular contractions   PLAN: 1. Continue fetal kick counts twice a day. 2. Continue antepartum testing as scheduled-Biweekly   Fenton Mallingebbie Kelley Knoth, LPN

## 2015-10-02 ENCOUNTER — Ambulatory Visit: Payer: Medicaid Other | Admitting: Obstetrics and Gynecology

## 2015-10-02 VITALS — BP 104/80 | HR 94 | Wt 176.8 lb

## 2015-10-02 DIAGNOSIS — Z3490 Encounter for supervision of normal pregnancy, unspecified, unspecified trimester: Secondary | ICD-10-CM

## 2015-10-02 NOTE — Progress Notes (Signed)
NONSTRESS TEST INTERPRETATION  INDICATIONS: Induction   FHR baseline: 150bpm RESULTS:Reactive COMMENTS: Pt scheduled for induction Sunday    PLAN: 1. Continue fetal kick counts twice a day. 2. Continue antepartum testing as scheduled-Biweekly   Debbe [place] Oshua Mcconaha, CMA

## 2015-10-04 ENCOUNTER — Encounter: Payer: Self-pay | Admitting: Anesthesiology

## 2015-10-04 ENCOUNTER — Inpatient Hospital Stay
Admission: EM | Admit: 2015-10-04 | Discharge: 2015-10-07 | DRG: 767 | Disposition: A | Discharge status: Home / Self Care | Payer: Medicaid Other | Attending: Obstetrics and Gynecology | Admitting: Obstetrics and Gynecology

## 2015-10-04 ENCOUNTER — Telehealth: Payer: Self-pay | Admitting: *Deleted

## 2015-10-04 DIAGNOSIS — Z3A39 39 weeks gestation of pregnancy: Secondary | ICD-10-CM

## 2015-10-04 DIAGNOSIS — Z23 Encounter for immunization: Secondary | ICD-10-CM

## 2015-10-04 DIAGNOSIS — O228X3 Other venous complications in pregnancy, third trimester: Secondary | ICD-10-CM | POA: Diagnosis present

## 2015-10-04 DIAGNOSIS — Z3483 Encounter for supervision of other normal pregnancy, third trimester: Secondary | ICD-10-CM | POA: Diagnosis not present

## 2015-10-04 DIAGNOSIS — O9902 Anemia complicating childbirth: Secondary | ICD-10-CM | POA: Diagnosis present

## 2015-10-04 DIAGNOSIS — Z302 Encounter for sterilization: Secondary | ICD-10-CM

## 2015-10-04 LAB — CBC
HEMATOCRIT: 28.8 % — AB (ref 35.0–47.0)
HEMOGLOBIN: 9.9 g/dL — AB (ref 12.0–16.0)
MCH: 27.6 pg (ref 26.0–34.0)
MCHC: 34.6 g/dL (ref 32.0–36.0)
MCV: 79.9 fL — AB (ref 80.0–100.0)
Platelets: 253 10*3/uL (ref 150–440)
RBC: 3.6 MIL/uL — AB (ref 3.80–5.20)
RDW: 14.3 % (ref 11.5–14.5)
WBC: 10.8 10*3/uL (ref 3.6–11.0)

## 2015-10-04 LAB — TYPE AND SCREEN
ABO/RH(D): A POS
ANTIBODY SCREEN: NEGATIVE

## 2015-10-04 MED ORDER — SOD CITRATE-CITRIC ACID 500-334 MG/5ML PO SOLN: 30.0000 mL | ORAL | Status: DC | PRN

## 2015-10-04 MED ORDER — ONDANSETRON HCL 4 MG/2ML IJ SOLN
4.0000 mg | Freq: Four times a day (QID) | INTRAMUSCULAR | Status: DC | PRN
Start: 2015-10-04 — End: 2015-10-05

## 2015-10-04 MED ORDER — LACTATED RINGERS IV SOLN
500.0000 mL | INTRAVENOUS | Status: DC | PRN
  Administered 2015-10-04: 500 mL via INTRAVENOUS

## 2015-10-04 MED ORDER — LIDOCAINE HCL (PF) 1 % IJ SOLN: 30.0000 mL | INTRAMUSCULAR | Status: DC | PRN

## 2015-10-04 MED ORDER — OXYTOCIN BOLUS FROM INFUSION
500.0000 mL | Freq: Once | INTRAVENOUS | Status: AC
  Administered 2015-10-05: 500 mL via INTRAVENOUS

## 2015-10-04 MED ORDER — FENTANYL CITRATE (PF) 100 MCG/2ML IJ SOLN: 50.0000 ug | INTRAMUSCULAR | Status: DC | PRN

## 2015-10-04 MED ORDER — TERBUTALINE SULFATE 1 MG/ML IJ SOLN: 0.2500 mg | Freq: Once | INTRAMUSCULAR | Status: DC | PRN

## 2015-10-04 MED ORDER — OXYTOCIN 40 UNITS IN LACTATED RINGERS INFUSION - SIMPLE MED: 2.5000 [IU]/h | INTRAVENOUS | Status: DC

## 2015-10-04 MED ORDER — LACTATED RINGERS IV SOLN
INTRAVENOUS | Status: DC
  Administered 2015-10-04: 125 mL/h via INTRAVENOUS

## 2015-10-04 MED ORDER — OXYTOCIN 40 UNITS IN LACTATED RINGERS INFUSION - SIMPLE MED
1.0000 m[IU]/min | INTRAVENOUS | Status: DC
  Administered 2015-10-04: 2 m[IU]/min via INTRAVENOUS
  Filled 2015-10-04: qty 1000

## 2015-10-04 MED ORDER — FENTANYL 2.5 MCG/ML W/ROPIVACAINE 0.2% IN NS 100 ML EPIDURAL INFUSION (ARMC-ANES)
EPIDURAL | Status: AC
  Filled 2015-10-04: qty 100

## 2015-10-04 NOTE — Anesthesia Preprocedure Evaluation (Deleted)
Anesthesia Evaluation  Patient identified by MRN, date of birth, ID band Patient awake    Reviewed: Allergy & Precautions, NPO status , Patient's Chart, lab work & pertinent test results  Airway Mallampati: II  TM Distance: >3 FB     Dental no notable dental hx.    Pulmonary neg pulmonary ROS,    Pulmonary exam normal        Cardiovascular negative cardio ROS Normal cardiovascular exam     Neuro/Psych negative neurological ROS  negative psych ROS   GI/Hepatic Neg liver ROS, IBS Hx   Endo/Other  negative endocrine ROS  Renal/GU negative Renal ROS  negative genitourinary   Musculoskeletal negative musculoskeletal ROS (+)   Abdominal Normal abdominal exam  (+)   Peds negative pediatric ROS (+)  Hematology negative hematology ROS (+)   Anesthesia Other Findings   Reproductive/Obstetrics (+) Pregnancy                             Anesthesia Physical Anesthesia Plan  ASA: II  Anesthesia Plan: Epidural   Post-op Pain Management:    Induction:   Airway Management Planned: Natural Airway  Additional Equipment:   Intra-op Plan:   Post-operative Plan:   Informed Consent: I have reviewed the patients History and Physical, chart, labs and discussed the procedure including the risks, benefits and alternatives for the proposed anesthesia with the patient or authorized representative who has indicated his/her understanding and acceptance.   Dental advisory given  Plan Discussed with: CRNA and Surgeon  Anesthesia Plan Comments:         Anesthesia Quick Evaluation

## 2015-10-04 NOTE — H&P (Signed)
Obstetric History and Physical  Joann Fitzpatrick is a 25 y.o. G2P1001 with IUP at 7475w1d presenting with irregular contractions for elective IOL due to h/o previous precipitous delivery, advanced cervical dilation and severe vulvar varicosities.. Patient states she has been having  irregular, every 2-3 minutes contractions, moderate vaginal bleeding, intact membranes, with active fetal movement.    [facility]  Pregnancy complications or risks:vulvar varicosities  Prenatal labs and studies: ABO, Rh:  A+ Antibody:  negative Rubella:  NI RPR:   NR HBsAg:   negative HIV:   negative ZOX:WRUEAVWUGBS:Negative (08/01 1643) 1 hr Glucola  normal Genetic screening normal Anatomy US normal  Past Medical History:  Diagnosis Date  . History of anemia   . History of chicken pox   . History of IBS   . Hx of iron deficiency anemia   . IBS (irritable bowel syndrome)   . Pregnancy related nausea, antepartum   . Pregnancy with uncertain dates   . 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 LAPAROSCOPY    . TONSILLECTOMY      OB History  Gravida Para Term Preterm AB Living  2 1 1     1   SAB TAB Ectopic Multiple Live Births          1    # Outcome Date GA Lbr Len/2nd Weight Sex Delivery Anes PTL Lv  2 Current           1 Term 2012 3424w0d  6 lb 12 oz (3.062 kg) F Vag-Spont None Y LIV     Complications: Other Excessive Bleeding      Social History   Social History  . Marital status: Married    Spouse name: N/A  . Number of children: N/A  . Years of education: N/A   Social History Main Topics  . Smoking status: Never Smoker  . Smokeless tobacco: Never Used  . Alcohol use No  . Drug use: No  . Sexual activity: No   Other Topics Concern  . Not on file   Social History Narrative  . No narrative on file    Family History  Problem Relation Age of Onset  . Renal cancer Other    aunt  . Asthma Other     grandparent  . Emphysema Other     grandparent  . Thyroid nodules Other     grandparent    Prescriptions Prior to Admission  Medication Sig Dispense Refill Last Dose  . folic acid (FOLVITE) 1 MG tablet Take 1 mg by mouth daily.   Taking  . Pediatric Multivit-Minerals-C (FLINTSTONES GUMMIES PLUS PO) Take 1 each by mouth daily.   Taking  . zolpidem (AMBIEN) 5 MG tablet Take 1 tablet (5 mg total) by mouth at bedtime as needed for sleep. 30 tablet 1 Taking    Allergies  Allergen Reactions  . Oxycodone-Acetaminophen Hives and Nausea Only    Review of Systems: Negative except for what is mentioned in HPI.  Physical Exam: Temp 99 F (37.2 C) (Oral)   Resp 20   Ht 5\' 9"  (1.753 m)   Wt 176 lb (79.8 kg)   LMP 01/03/2015   BMI 25.99 kg/m  GENERAL: Well-developed, well-nourished female in no acute distress.  LUNGS: Clear to auscultation bilaterally.  HEART: Regular rate and rhythm. ABDOMEN: Soft, nontender, nondistended, gravid. EXTREMITIES: Nontender, no edema, 2+ distal pulses. Cervical Exam:  Dilation: 5 Effacement (%):  (75%) Station: -1 Presentation: Vertex Exam by:: Millner, RN  FHT:  Baseline rate 150 bpm   Variability moderate  Accelerations present   Decelerations none Contractions: Every 2-3 mins on 2 mu/min pitocin   Pertinent Labs/Studies:   Results for orders placed or performed during the hospital encounter of 10/04/15 (from the past 24 hour(s))  CBC     Status: Abnormal   Collection Time: 10/04/15  7:58 PM  Result Value Ref Range   WBC 10.8 3.6 - 11.0 K/uL   RBC 3.60 (L) 3.80 - 5.20 MIL/uL   Hemoglobin 9.9 (L) 12.0 - 16.0 g/dL   HCT 30.8 (L) 65.7 - 84.6 %   MCV 79.9 (L) 80.0 - 100.0 fL   MCH 27.6 26.0 - 34.0 pg   MCHC 34.6 32.0 - 36.0 g/dL   RDW 96.2 95.2 - 84.1 %   Platelets 253 150 - 440 K/uL    Assessment : Joann Fitzpatrick is a 25 y.o. G2P1001 at [redacted]w[redacted]d being admitted for labor. Will need varicella titer.  Plan: Labor:  Expectant management.  Induction/Augmentation as needed, per protocol FWB: Reassuring fetal heart tracing.  GBS negative Delivery plan: Hopeful for vaginal delivery  Jhamari Markowicz, CNM [facility], [facility]

## 2015-10-04 NOTE — Progress Notes (Signed)
Anesthesia unsuccessful, pt. With c/o of back pain, heating pad applied.  Pt. Desires nitrous oxide.  Nurse will set up.

## 2015-10-04 NOTE — Progress Notes (Signed)
Patient consented for Labor Epidural and patient agreed to procedure.  Back prepped and draped in sterile fashion and skin wheal made with !% Lidocaine plain.  Epidural attempted, but patient tearful and unable to continue after few attempts.  No paresthesias or blood.  Epidural was reached on one occasion , but unable to thread the catheter.  Procedure was aborted.

## 2015-10-04 NOTE — Progress Notes (Signed)
Jackson LatinoBreanna A Schoon is a 25 y.o. G2P1001 at 2060w1d by LMP admitted for induction of labor due to Elective at term and favorable cervix, severe vulvar varicosities.  Subjective: Reports moderate pain relief with Nitrious Oxide use  Objective: BP 110/70   Pulse 79   Temp 98.8 F (37.1 C) (Oral)   Resp 20   Ht 5\' 9"  (1.753 m)   Wt 176 lb (79.8 kg)   LMP 01/03/2015   BMI 25.99 kg/m  No intake/output data recorded. No intake/output data recorded.  FHT:  FHR: 168 bpm, variability: moderate,  accelerations:  Present,  decelerations:  Absent UC:   regular, every 3 minutes, on 5 mu/min pitocin SVE:   6.5/80/0-AROM with copious amount clear fluid Labs: Lab Results  Component Value Date   WBC 10.8 10/04/2015   HGB 9.9 (L) 10/04/2015   HCT 28.8 (L) 10/04/2015   MCV 79.9 (L) 10/04/2015   PLT 253 10/04/2015    Assessment / Plan: Induction of labor due to term with favorable cervix,  progressing well on pitocin  Labor: Progressing on Pitocin, will continue to increase then AROM Preeclampsia:  labs stable Fetal Wellbeing:  Category I Pain Control:  Nitrous Oxide I/D:  n/a Anticipated MOD:  NSVD  Melody N Shambley 10/04/2015, 11:43 PM

## 2015-10-05 ENCOUNTER — Encounter: Payer: Self-pay | Admitting: *Deleted

## 2015-10-05 DIAGNOSIS — Z3483 Encounter for supervision of other normal pregnancy, third trimester: Secondary | ICD-10-CM

## 2015-10-05 DIAGNOSIS — Z302 Encounter for sterilization: Secondary | ICD-10-CM

## 2015-10-05 LAB — CBC
HEMATOCRIT: 27.8 % — AB (ref 35.0–47.0)
HEMOGLOBIN: 9.6 g/dL — AB (ref 12.0–16.0)
MCH: 27.4 pg (ref 26.0–34.0)
MCHC: 34.3 g/dL (ref 32.0–36.0)
MCV: 79.7 fL — AB (ref 80.0–100.0)
Platelets: 221 10*3/uL (ref 150–440)
RBC: 3.49 MIL/uL — AB (ref 3.80–5.20)
RDW: 14.5 % (ref 11.5–14.5)
WBC: 16 10*3/uL — ABNORMAL HIGH (ref 3.6–11.0)

## 2015-10-05 MED ORDER — ONDANSETRON HCL 4 MG PO TABS: 4.0000 mg | ORAL_TABLET | ORAL | Status: DC | PRN

## 2015-10-05 MED ORDER — DIPHENHYDRAMINE HCL 25 MG PO CAPS: 25.0000 mg | ORAL_CAPSULE | Freq: Four times a day (QID) | ORAL | Status: DC | PRN

## 2015-10-05 MED ORDER — MEASLES, MUMPS & RUBELLA VAC ~~LOC~~ INJ
0.5000 mL | INJECTION | Freq: Once | SUBCUTANEOUS | Status: AC
Start: 2015-10-06 — End: 2015-10-07
  Administered 2015-10-07: 0.5 mL via SUBCUTANEOUS
  Filled 2015-10-05 (×2): qty 0.5

## 2015-10-05 MED ORDER — LIDOCAINE HCL (PF) 1 % IJ SOLN
INTRAMUSCULAR | Status: AC
  Filled 2015-10-05: qty 30

## 2015-10-05 MED ORDER — DOCUSATE SODIUM 100 MG PO CAPS
100.0000 mg | ORAL_CAPSULE | Freq: Two times a day (BID) | ORAL | Status: DC
  Administered 2015-10-05 – 2015-10-07 (×4): 100 mg via ORAL
  Filled 2015-10-05 (×4): qty 1

## 2015-10-05 MED ORDER — COCONUT OIL OIL
1.0000 "application " | TOPICAL_OIL | Status: DC | PRN
  Filled 2015-10-05: qty 120

## 2015-10-05 MED ORDER — SIMETHICONE 80 MG PO CHEW
80.0000 mg | CHEWABLE_TABLET | ORAL | Status: DC | PRN
  Administered 2015-10-06 – 2015-10-07 (×3): 80 mg via ORAL
  Filled 2015-10-05 (×3): qty 1

## 2015-10-05 MED ORDER — PRENATAL MULTIVITAMIN CH
1.0000 | ORAL_TABLET | Freq: Every day | ORAL | Status: DC
  Administered 2015-10-05 – 2015-10-06 (×2): 1 via ORAL
  Filled 2015-10-05 (×2): qty 1

## 2015-10-05 MED ORDER — CEFAZOLIN SODIUM-DEXTROSE 2-4 GM/100ML-% IV SOLN
2.0000 g | INTRAVENOUS | Status: AC
  Administered 2015-10-06: 2 g via INTRAVENOUS
  Administered 2015-10-06: 2000 mg via INTRAVENOUS

## 2015-10-05 MED ORDER — SENNOSIDES-DOCUSATE SODIUM 8.6-50 MG PO TABS
2.0000 | ORAL_TABLET | ORAL | Status: DC
  Administered 2015-10-05: 2 via ORAL
  Filled 2015-10-05: qty 2

## 2015-10-05 MED ORDER — WITCH HAZEL-GLYCERIN EX PADS: 1.0000 "application " | MEDICATED_PAD | CUTANEOUS | Status: DC | PRN

## 2015-10-05 MED ORDER — IBUPROFEN 600 MG PO TABS
600.0000 mg | ORAL_TABLET | Freq: Four times a day (QID) | ORAL | Status: DC
  Administered 2015-10-05 – 2015-10-07 (×8): 600 mg via ORAL
  Filled 2015-10-05 (×7): qty 1

## 2015-10-05 MED ORDER — DIBUCAINE 1 % RE OINT: 1.0000 "application " | TOPICAL_OINTMENT | RECTAL | Status: DC | PRN

## 2015-10-05 MED ORDER — ONDANSETRON HCL 4 MG/2ML IJ SOLN: 4.0000 mg | INTRAMUSCULAR | Status: DC | PRN

## 2015-10-05 MED ORDER — BENZOCAINE-MENTHOL 20-0.5 % EX AERO
1.0000 "application " | INHALATION_SPRAY | CUTANEOUS | Status: DC | PRN
  Filled 2015-10-05: qty 56

## 2015-10-05 MED ORDER — BENZOCAINE-MENTHOL 20-0.5 % EX AERO
INHALATION_SPRAY | CUTANEOUS | Status: AC
  Administered 2015-10-05: 04:00:00
  Filled 2015-10-05: qty 56

## 2015-10-05 MED ORDER — HYDROMORPHONE HCL 2 MG PO TABS
2.0000 mg | ORAL_TABLET | ORAL | Status: DC | PRN
  Administered 2015-10-05 – 2015-10-07 (×8): 2 mg via ORAL
  Filled 2015-10-05 (×8): qty 1

## 2015-10-05 MED ORDER — SOD CITRATE-CITRIC ACID 500-334 MG/5ML PO SOLN: 30.0000 mL | ORAL | Status: DC

## 2015-10-05 NOTE — Final Progress Note (Signed)
L&D OB Triage Note  SUBJECTIVE Joann Fitzpatrick is a 25 y.o. [id number] female at 3984w2d, EDD Estimated Date of Delivery: 10/10/15 who presented to triage with complaints of abdominal cramping.    OBJECTIVE Nursing Evaluation: BP 117/74   Pulse (!) 103   Temp 98.4 F (36.9 C) (Oral)   Resp 16   Ht 5\' 9"  (1.753 m)   Wt 174 lb (78.9 kg)   LMP 01/03/2015   BMI 25.70 kg/m  no significant findings for labor  NST was performed and has been reviewed by me.  NST INTERPRETATION: Indications: Abdominal cramping  Mode: External Baseline Rate (A): 145 bpm Variability: Moderate Accelerations: 15 x 15 Decelerations: None     Contraction Frequency (min): rare (with irritability)  ASSESSMENT Impression:  1. Pregnancy:  [id number] at 7584w2d , EDD Estimated Date of Delivery: 10/10/15 2.  Reactive NST  PLAN 1. Reassurance given 2. Discharge home with bleeding/labor precautions.  3. Continue routine prenatal care.   Herold HarmsMartin A Defrancesco, MD

## 2015-10-06 ENCOUNTER — Inpatient Hospital Stay: Payer: Medicaid Other | Admitting: Anesthesiology

## 2015-10-06 ENCOUNTER — Encounter: Payer: Medicaid Other | Admitting: Obstetrics and Gynecology

## 2015-10-06 ENCOUNTER — Encounter
Admission: EM | Disposition: A | Discharge status: Home / Self Care | Payer: Self-pay | Source: Home / Self Care | Attending: Obstetrics and Gynecology

## 2015-10-06 ENCOUNTER — Encounter: Payer: Self-pay | Admitting: Anesthesiology

## 2015-10-06 DIAGNOSIS — Z302 Encounter for sterilization: Secondary | ICD-10-CM

## 2015-10-06 HISTORY — PX: TUBAL LIGATION: SHX77

## 2015-10-06 LAB — RPR: RPR Ser Ql: NONREACTIVE

## 2015-10-06 LAB — VARICELLA ZOSTER ANTIBODY, IGG: VARICELLA IGG: 242 {index} (ref >165)

## 2015-10-06 SURGERY — LIGATION, FALLOPIAN TUBE, POSTPARTUM
Anesthesia: General | Site: Abdomen | Laterality: Bilateral | Wound class: Clean Contaminated

## 2015-10-06 MED ORDER — DEXAMETHASONE SODIUM PHOSPHATE 10 MG/ML IJ SOLN
INTRAMUSCULAR | Status: DC | PRN
  Administered 2015-10-06: 10 mg via INTRAVENOUS

## 2015-10-06 MED ORDER — BUPIVACAINE HCL (PF) 0.5 % IJ SOLN
INTRAMUSCULAR | Status: AC
  Filled 2015-10-06: qty 30

## 2015-10-06 MED ORDER — ONDANSETRON HCL 4 MG/2ML IJ SOLN
INTRAMUSCULAR | Status: DC | PRN
  Administered 2015-10-06: 4 mg via INTRAVENOUS

## 2015-10-06 MED ORDER — LACTATED RINGERS IV SOLN
INTRAVENOUS | Status: DC | PRN
  Administered 2015-10-06: 12:00:00 via INTRAVENOUS

## 2015-10-06 MED ORDER — BUPIVACAINE HCL 0.5 % IJ SOLN
INTRAMUSCULAR | Status: DC | PRN
  Administered 2015-10-06: 6 mL

## 2015-10-06 MED ORDER — FENTANYL CITRATE (PF) 100 MCG/2ML IJ SOLN
25.0000 ug | INTRAMUSCULAR | Status: AC | PRN
  Administered 2015-10-06 (×6): 25 ug via INTRAVENOUS

## 2015-10-06 MED ORDER — FENTANYL CITRATE (PF) 100 MCG/2ML IJ SOLN
INTRAMUSCULAR | Status: DC | PRN
  Administered 2015-10-06: 50 ug via INTRAVENOUS

## 2015-10-06 MED ORDER — ONDANSETRON HCL 4 MG/2ML IJ SOLN
INTRAMUSCULAR | Status: AC
  Administered 2015-10-06: 4 mg via INTRAVENOUS
  Filled 2015-10-06: qty 2

## 2015-10-06 MED ORDER — NEOSTIGMINE METHYLSULFATE 10 MG/10ML IV SOLN
INTRAVENOUS | Status: DC | PRN
  Administered 2015-10-06: 3 mg via INTRAVENOUS

## 2015-10-06 MED ORDER — PROPOFOL 10 MG/ML IV BOLUS
INTRAVENOUS | Status: DC | PRN
  Administered 2015-10-06: 150 mg via INTRAVENOUS

## 2015-10-06 MED ORDER — CEFAZOLIN SODIUM-DEXTROSE 2-4 GM/100ML-% IV SOLN
INTRAVENOUS | Status: AC
  Administered 2015-10-06: 2000 mg via INTRAVENOUS
  Filled 2015-10-06: qty 100

## 2015-10-06 MED ORDER — GLYCOPYRROLATE 0.2 MG/ML IJ SOLN
INTRAMUSCULAR | Status: DC | PRN
  Administered 2015-10-06: 0.4 mg via INTRAVENOUS
  Administered 2015-10-06: 0.2 mg via INTRAVENOUS

## 2015-10-06 MED ORDER — ACETAMINOPHEN 10 MG/ML IV SOLN
INTRAVENOUS | Status: AC
Start: 2015-10-06 — End: 2015-10-06
  Filled 2015-10-06: qty 100

## 2015-10-06 MED ORDER — ONDANSETRON HCL 4 MG/2ML IJ SOLN
4.0000 mg | Freq: Once | INTRAMUSCULAR | Status: AC | PRN
  Administered 2015-10-06: 4 mg via INTRAVENOUS

## 2015-10-06 MED ORDER — IBUPROFEN 600 MG PO TABS
600.0000 mg | ORAL_TABLET | Freq: Four times a day (QID) | ORAL | Status: DC | PRN
  Filled 2015-10-06: qty 1

## 2015-10-06 MED ORDER — HYDROMORPHONE HCL 1 MG/ML IJ SOLN
INTRAMUSCULAR | Status: AC
  Administered 2015-10-06: 0.25 mg via INTRAVENOUS
  Filled 2015-10-06: qty 1

## 2015-10-06 MED ORDER — ROCURONIUM BROMIDE 100 MG/10ML IV SOLN
INTRAVENOUS | Status: DC | PRN
  Administered 2015-10-06: 30 mg via INTRAVENOUS

## 2015-10-06 MED ORDER — HYDROMORPHONE HCL 1 MG/ML IJ SOLN
0.2500 mg | INTRAMUSCULAR | Status: AC | PRN
  Administered 2015-10-06 (×4): 0.25 mg via INTRAVENOUS

## 2015-10-06 MED ORDER — LIDOCAINE HCL (CARDIAC) 20 MG/ML IV SOLN
INTRAVENOUS | Status: DC | PRN
  Administered 2015-10-06: 100 mg via INTRAVENOUS

## 2015-10-06 MED ORDER — FENTANYL CITRATE (PF) 100 MCG/2ML IJ SOLN
INTRAMUSCULAR | Status: AC
  Administered 2015-10-06: 25 ug via INTRAVENOUS
  Filled 2015-10-06: qty 2

## 2015-10-06 SURGICAL SUPPLY — 26 items
BLADE SURG 15 STRL LF DISP TIS (BLADE) ×1 IMPLANT
BLADE SURG 15 STRL SS (BLADE) ×1
CHLORAPREP W/TINT 26ML (MISCELLANEOUS) ×2 IMPLANT
DRAPE LAPAROTOMY 100X77 ABD (DRAPES) ×2 IMPLANT
DRESSING TELFA 4X3 1S ST N-ADH (GAUZE/BANDAGES/DRESSINGS) ×2 IMPLANT
DRSG TEGADERM 2-3/8X2-3/4 SM (GAUZE/BANDAGES/DRESSINGS) ×2 IMPLANT
GAUZE SPONGE NON-WVN 2X2 STRL (MISCELLANEOUS) ×1 IMPLANT
GLOVE BIO SURGEON STRL SZ 6 (GLOVE) ×2 IMPLANT
GLOVE BIOGEL PI IND STRL 6.5 (GLOVE) ×1 IMPLANT
GLOVE BIOGEL PI INDICATOR 6.5 (GLOVE) ×1
GOWN STRL REUS W/ TWL LRG LVL3 (GOWN DISPOSABLE) ×2 IMPLANT
GOWN STRL REUS W/TWL LRG LVL3 (GOWN DISPOSABLE) ×2
KIT RM TURNOVER CYSTO AR (KITS) ×2 IMPLANT
LABEL OR SOLS (LABEL) ×2 IMPLANT
NEEDLE HYPO 25GX1X1/2 BEV (NEEDLE) ×2 IMPLANT
NS IRRIG 500ML POUR BTL (IV SOLUTION) ×2 IMPLANT
PACK BASIN MINOR ARMC (MISCELLANEOUS) ×2 IMPLANT
SPONGE VERSALON 2X2 STRL (MISCELLANEOUS) ×1
SUT MNCRL 4-0 (SUTURE) ×1
SUT MNCRL 4-0 27XMFL (SUTURE) ×1
SUT PLAIN GUT 0 (SUTURE) ×4 IMPLANT
SUT VIC AB 0 CT1 36 (SUTURE) ×2 IMPLANT
SUT VIC AB 0 SH 27 (SUTURE) ×2 IMPLANT
SUT VICRYL 0 AB UR-6 (SUTURE) ×4 IMPLANT
SUTURE MNCRL 4-0 27XMF (SUTURE) ×1 IMPLANT
SYRINGE 10CC LL (SYRINGE) ×2 IMPLANT

## 2015-10-06 NOTE — Anesthesia Preprocedure Evaluation (Addendum)
Anesthesia Evaluation  Patient identified by MRN, date of birth, ID band Patient awake    Reviewed: Allergy & Precautions, NPO status , Patient's Chart, lab work & pertinent test results  Airway Mallampati: II  TM Distance: >3 FB     Dental no notable dental hx.    Pulmonary neg pulmonary ROS,    Pulmonary exam normal        Cardiovascular negative cardio ROS Normal cardiovascular exam     Neuro/Psych negative neurological ROS  negative psych ROS   GI/Hepatic Neg liver ROS, IBS   Endo/Other  negative endocrine ROS  Renal/GU negative Renal ROS  negative genitourinary   Musculoskeletal negative musculoskeletal ROS (+)   Abdominal Normal abdominal exam  (+)   Peds negative pediatric ROS (+)  Hematology negative hematology ROS (+)   Anesthesia Other Findings   Reproductive/Obstetrics (+) Pregnancy                             Anesthesia Physical  Anesthesia Plan  ASA: II  Anesthesia Plan: General   Post-op Pain Management:    Induction: Intravenous  Airway Management Planned: Natural Airway and Oral ETT  Additional Equipment:   Intra-op Plan:   Post-operative Plan: Extubation in OR  Informed Consent: I have reviewed the patients History and Physical, chart, labs and discussed the procedure including the risks, benefits and alternatives for the proposed anesthesia with the patient or authorized representative who has indicated his/her understanding and acceptance.   Dental advisory given  Plan Discussed with: CRNA and Surgeon  Anesthesia Plan Comments:         Anesthesia Quick Evaluation

## 2015-10-06 NOTE — Progress Notes (Signed)
Post Partum Day 1 Subjective: up ad lib, voiding and moderate back and perineal pain  Objective: Blood pressure 108/72, pulse 77, temperature 98.6 F (37 C), temperature source Oral, resp. rate 20, height 5\' 9"  (1.753 m), weight 176 lb (79.8 kg), last menstrual period 01/03/2015, SpO2 100 %, unknown if currently breastfeeding.  Physical Exam:  General: alert, cooperative, appears stated age and pale Lochia: appropriate Uterine Fundus: firm Incision: NA DVT Evaluation: No evidence of DVT seen on physical exam. Negative Homan's sign.   Recent Labs  10/04/15 1958 10/05/15 0531  HGB 9.9* 9.6*  HCT 28.8* 27.8*    Assessment/Plan: Breastfeeding and Contraception will have PPTL today, considering discharge this evening or in the morning. Infant feeding only Breast;  LOS: 2 days   [employer] MicrosystemsMelody N Shambley, CNM 10/06/2015, 8:20 AM

## 2015-10-06 NOTE — Op Note (Addendum)
OPERATIVE NOTE:  Ashantae A Paluch PROCEDURE DATE: 10/06/2015   PREOPERATIVE DIAGNOSIS:  1. Postpartum 2. Desires elective sterilization  POSTOPERATIVE DIAGNOSIS: Same as above  PROCEDURE: Bilateral partial salpingectomy  SURGEON:  Prentice DockerMartin A Kam Kushnir, MD ASSISTANTS: None ANESTHESIA: General INDICATIONS: 25 y.o. [id number] presents for postpartum bilateral tubal ligation  FINDINGS:  Normal fallopian tubes and ovaries bilaterally   I/O's: Total I/O In: -  Out: 10 [Blood:10]2 COUNTS:  YES SPECIMENS: Right and left fallopian tube segments ANTIBIOTIC PROPHYLAXIS:Ancef 2 grams COMPLICATIONS: None immediate  PROCEDURE IN DETAIL: Patient was brought to the operating room and placed in supine position. General endotracheal anesthesia was induced without difficulty. A ChloraPrep abdominal prep and drape was performed in standard fashion. The patient had previously voided and straight catheterization was not. Timeout was completed. Subumbilical transverse incision 3 cm was made. Entry into the peritoneal cavity was performed. [employer] retractors were used to help with exposure. Sequentially the right and left fallopian tubes were identified and grasped with Babcock clamps. The tube was followed out to the fimbriated end to verify that the appropriate structure was being ligated. Regrasping of the tube toward central portion of the fallopian tube was followed by suture ligation with 2 sutures. First stitch was a free tie. Second stitch was a stick tie. The intervening tube segment was resected. Similar procedure was carried out on contralateral tube. Fallopian tubes were sent to pathology. Once satisfied with hemostasis at the tubal ligation site, the procedure was terminated. The fascia and peritoneum were reapproximated using 0 Vicryl suture in simple running manner. The skin was closed with subcuticular stitch of 4-0 Monocryl. Telfa/Tegaderm dressing was placed over the incision. Upon completion  of the procedure patient was awakened and taken to recovery in satisfactory condition.  Doristine Shehan A. Beatris Sie Francesco, MD, [facility]

## 2015-10-06 NOTE — Interval H&P Note (Signed)
History and Physical Interval Note:  10/06/2015 11:57 AM  Joann Fitzpatrick  has presented today for surgery, with the diagnosis of desires permanant sterility  The various methods of treatment have been discussed with the patient and family. After consideration of risks, benefits and other options for treatment, the patient has consented to  Procedure(s): POST PARTUM TUBAL LIGATION (N/A) as a surgical intervention .  The patient's history has been reviewed, patient examined, no change in status, stable for surgery.  I have reviewed the patient's chart and labs.  Questions were answered to the patient's satisfaction.     Daphine DeutscherMartin A Posey Petrik

## 2015-10-06 NOTE — Anesthesia Procedure Notes (Signed)
Procedure Name: Intubation Date/Time: 10/06/2015 12:13 PM Performed by: Stormy FabianURTIS, Jerick Khachatryan Pre-anesthesia Checklist: Patient identified, Patient being monitored, Timeout performed, Emergency Drugs available and Suction available Patient Re-evaluated:Patient Re-evaluated prior to inductionOxygen Delivery Method: Circle system utilized Preoxygenation: Pre-oxygenation with 100% oxygen Intubation Type: IV induction Ventilation: Mask ventilation without difficulty Laryngoscope Size: Mac and 3 Grade View: Grade I Tube type: Oral Tube size: 7.0 mm Number of attempts: 1 Airway Equipment and Method: Stylet Placement Confirmation: ETT inserted through vocal cords under direct vision,  positive ETCO2 and breath sounds checked- equal and bilateral Secured at: 22 cm Tube secured with: Tape Dental Injury: Teeth and Oropharynx as per pre-operative assessment

## 2015-10-06 NOTE — Anesthesia Postprocedure Evaluation (Signed)
Anesthesia Post Note  Patient: Joann Fitzpatrick  Procedure(s) Performed: Procedure(s) (LRB): POST PARTUM TUBAL LIGATION (Bilateral)  Patient location during evaluation: PACU Anesthesia Type: General Level of consciousness: awake and alert and oriented Pain management: pain level controlled Vital Signs Assessment: post-procedure vital signs reviewed and stable Respiratory status: spontaneous breathing Cardiovascular status: blood pressure returned to baseline Anesthetic complications: no    Last Vitals:  Vitals:   10/06/15 1432 10/06/15 1530  BP: 128/72 109/65  Pulse: 64 70  Resp: 18 18  Temp: 36.9 C 36.5 C    Last Pain:  Vitals:   10/06/15 1530  TempSrc: Oral  PainSc:                  [name]

## 2015-10-06 NOTE — Transfer of Care (Signed)
Immediate Anesthesia Transfer of Care Note  Patient: Joann Fitzpatrick  Procedure(s) Performed: Procedure(s): POST PARTUM TUBAL LIGATION (Bilateral)  Patient Location: PACU  Anesthesia Type:General  Level of Consciousness: sedated  Airway & Oxygen Therapy: Patient Spontanous Breathing and Patient connected to face mask oxygen  Post-op Assessment: Report given to RN and Post -op Vital signs reviewed and stable  Post vital signs: Reviewed and stable  Last Vitals:  Vitals:   10/06/15 1127 10/06/15 1300  BP: 118/74 112/71  Pulse: 86 72  Resp: 20 13  Temp: (!) 36.1 C 36.6 C    Complications: No apparent anesthesia complications

## 2015-10-07 ENCOUNTER — Encounter: Payer: Self-pay | Admitting: Obstetrics and Gynecology

## 2015-10-07 LAB — SURGICAL PATHOLOGY

## 2015-10-07 MED ORDER — HYDROMORPHONE HCL 2 MG PO TABS: 2.0000 mg | ORAL_TABLET | ORAL | 0 refills | Status: DC | PRN

## 2015-10-07 MED ORDER — FUSION PLUS PO CAPS: 1.0000 | ORAL_CAPSULE | Freq: Every day | ORAL | 1 refills | Status: DC

## 2015-10-07 MED ORDER — DOCUSATE SODIUM 100 MG PO CAPS: 100.0000 mg | ORAL_CAPSULE | Freq: Every day | ORAL | Status: DC

## 2015-10-07 MED ORDER — VITAMIN D3 125 MCG (5000 UT) PO CAPS: 1.0000 | ORAL_CAPSULE | Freq: Every day | ORAL | 2 refills | Status: DC

## 2015-10-07 MED ORDER — IBUPROFEN 600 MG PO TABS: 600.0000 mg | ORAL_TABLET | Freq: Four times a day (QID) | ORAL | 0 refills | Status: DC

## 2015-10-07 NOTE — Discharge Summary (Signed)
Obstetric Discharge Summary Reason for Admission: induction of labor Prenatal Procedures: NST and ultrasound Intrapartum Procedures: spontaneous vaginal delivery Postpartum Procedures: Rubella Ig Complications-Operative and Postpartum: 1st degree perineal laceration and PPTL Hemoglobin  Date Value Ref Range Status  10/05/2015 9.6 (L) 12.0 - 16.0 g/dL Final   HCT  Date Value Ref Range Status  10/05/2015 27.8 (L) 35.0 - 47.0 % Final    Physical Exam:  General: alert, cooperative and appears stated age 74Lochia: appropriate Uterine Fundus: firm Incision: healing well, no significant drainage DVT Evaluation: No evidence of DVT seen on physical exam. Negative Homan's sign.  Discharge Diagnoses: Term Pregnancy-delivered and PPTL, anemia  Discharge Information: Date: 10/07/2015 Activity: pelvic rest Diet: routine Medications: PNV, Ibuprofen, Colace, Iron and vit D 3, Diluadid Condition: stable Instructions: refer to practice specific booklet Discharge to: home   Newborn Data: Live born female  Birth Weight: 7 lb 9.3 oz (3440 g) APGAR: 9, 9  Home with mother.  Jermar Colter [employer] Kensli Bowley, CNM 10/07/2015, 8:31 AM

## 2015-10-07 NOTE — Discharge Instructions (Signed)
Follow up sooner with fever, problems breathing, pain not helped by medications, severe depression( more than just baby blues, wanting to hurt yourself or the baby), severe bleeding ( saturating more than one pad an hour or large palm sized clots), no heavy lifting , no driving while taking narcotics, no douches, intercourse, tampons or enemas for 6 weeks  F/u sooner also  with increased redness, swelling , discharge or increased pain at incision site.

## 2015-10-07 NOTE — Progress Notes (Signed)
All discharge instructions given to patient and she voices understanding of all instructions given. Prescription given . Patient will make her own 6 wks f/u appt. Patient discharged home with infant in arms escorted out in wheelchair.

## 2015-11-18 ENCOUNTER — Encounter: Payer: Self-pay | Admitting: Obstetrics and Gynecology

## 2015-11-18 ENCOUNTER — Ambulatory Visit (INDEPENDENT_AMBULATORY_CARE_PROVIDER_SITE_OTHER): Payer: Medicaid Other | Admitting: Obstetrics and Gynecology

## 2015-11-18 NOTE — Progress Notes (Signed)
   Subjective:     Joann Fitzpatrick is a 25 y.o. female who presents for a postpartum visit. She is 6 weeks postpartum following a spontaneous vaginal delivery. I have fully reviewed the prenatal and intrapartum course. The delivery was at 38 gestational weeks. Outcome: spontaneous vaginal delivery. Anesthesia: epidural. Postpartum course has been uncomplicated. Baby's course has been uncomplicated. Baby is feeding by breast. Bleeding no bleeding. Bowel function is normal. Bladder function is normal. Patient is not sexually active. Contraception method is abstinence and tubal ligation. Postpartum depression screening: negative.  The following portions of the patient's history were reviewed and updated as appropriate: allergies, current medications, past family history, past medical history, past social history, past surgical history and problem list.  Review of Systems A comprehensive review of systems was negative.   Objective:    BP 109/68   Pulse 78   Ht 5' 9.5" (1.765 m)   Wt 149 lb 9.6 oz (67.9 kg)   Breastfeeding? Yes   BMI 21.78 kg/m   General:  alert, cooperative and appears stated age   Breasts:  inspection negative, no nipple discharge or bleeding, no masses or nodularity palpable  Lungs: clear to auscultation bilaterally  Heart:  regular rate and rhythm, S1, S2 normal, no murmur, click, rub or gallop  Abdomen: soft, non-tender; bowel sounds normal; no masses,  no organomegaly   Vulva:  normal  Vagina: normal vagina, no discharge, exudate, lesion, or erythema  Cervix:  multiparous appearance  Corpus: normal size, contour, position, consistency, mobility, non-tender  Adnexa:  no mass, fullness, tenderness  Rectal Exam: Not performed.        Assessment:     6 weeks postpartum exam. Pap smear not done at today's visit.   Plan:    1. Contraception: abstinence and tubal ligation 2. PP anemia 3. Follow up in: 4 months for AE.

## 2015-11-18 NOTE — Patient Instructions (Signed)
  Place postpartum visit patient instructions here.  

## 2015-11-20 LAB — B12 AND FOLATE PANEL
FOLATE: 13.1 ng/mL (ref >3.0)
Vitamin B-12: 260 pg/mL (ref 211–946)

## 2015-11-20 LAB — CBC WITH DIFFERENTIAL/PLATELET
BASOS ABS: 0 10*3/uL (ref 0.0–0.2)
BASOS: 1 %
EOS (ABSOLUTE): 0.1 10*3/uL (ref 0.0–0.4)
Eos: 1 %
Hematocrit: 39.7 % (ref 34.0–46.6)
Hemoglobin: 13.3 g/dL (ref 11.1–15.9)
Immature Grans (Abs): 0 10*3/uL (ref 0.0–0.1)
Immature Granulocytes: 0 %
LYMPHS ABS: 3 10*3/uL (ref 0.7–3.1)
Lymphs: 47 %
MCH: 27.1 pg (ref 26.6–33.0)
MCHC: 33.5 g/dL (ref 31.5–35.7)
MCV: 81 fL (ref 79–97)
MONOS ABS: 0.5 10*3/uL (ref 0.1–0.9)
Monocytes: 8 %
NEUTROS ABS: 2.7 10*3/uL (ref 1.4–7.0)
Neutrophils: 43 %
PLATELETS: 287 10*3/uL (ref 150–379)
RBC: 4.9 x10E6/uL (ref 3.77–5.28)
RDW: 17.6 % — AB (ref 12.3–15.4)
WBC: 6.4 10*3/uL (ref 3.4–10.8)

## 2015-11-20 LAB — VITAMIN D 25 HYDROXY (VIT D DEFICIENCY, FRACTURES): Vit D, 25-Hydroxy: 40.9 ng/mL (ref 30.0–100.0)

## 2015-11-20 LAB — IRON: Iron: 145 ug/dL (ref 27–159)

## 2015-12-11 ENCOUNTER — Ambulatory Visit (INDEPENDENT_AMBULATORY_CARE_PROVIDER_SITE_OTHER): Payer: Medicaid Other | Admitting: Obstetrics and Gynecology

## 2015-12-11 ENCOUNTER — Encounter: Payer: Self-pay | Admitting: Obstetrics and Gynecology

## 2015-12-11 ENCOUNTER — Telehealth: Payer: Self-pay | Admitting: Obstetrics and Gynecology

## 2015-12-11 VITALS — BP 110/69 | HR 94 | Wt 143.9 lb

## 2015-12-11 DIAGNOSIS — N61 Mastitis without abscess: Secondary | ICD-10-CM

## 2015-12-11 MED ORDER — DICLOXACILLIN SODIUM 500 MG PO CAPS: 500.0000 mg | ORAL_CAPSULE | Freq: Four times a day (QID) | ORAL | 1 refills | Status: DC

## 2015-12-11 NOTE — Telephone Encounter (Signed)
Please bring her in for exam

## 2015-12-11 NOTE — Telephone Encounter (Signed)
appt made

## 2015-12-11 NOTE — Progress Notes (Signed)
Subjective:     Patient ID: Joann Fitzpatrick, female   DOB: 1990-10-11, 25 y.o.   MRN: [id number]  HPI States that for 1 day she has left breast discomfort that has progressively gotten worse. She tred to latch infant, very uncomfortable so unlatched her and pumped. Usually gets 6-8 oz with pumping and only able to get 2 oz at last pump. Feels achy and had a low grade temp of 99.4. Has gone a little longer between pumps/feeds due to going back to work part-time. Feels like overall her milk supply is decreasing as well.   Review of Systems Negative except stated in HPI    Objective:   Physical Exam A&O x4  well groomed female Blood pressure 110/69, pulse 94, weight 143 lb 14.4 oz (65.3 kg), currently breastfeeding. Breasts: breasts appear normal, no suspicious masses, no skin or nipple changes or axillary nodes, lactating, nipples normal. Erythema in streaks on inner left breast that are tender to touch.    Assessment:     Left breast mastitis    Plan:     rx sent for dicloxacillin 500mg  qid x 7 days. Instructed to rest & push fluids. RTC as needed.  Melody [place], CNM

## 2015-12-11 NOTE — Telephone Encounter (Signed)
SHE THINKS SHE MAY HAVE MASSTITIS, LEFT BREAST IS HURTING./ NOTHING IS GETTING RID OF THE PAIN 4 RED BOTCHES, NOT WARM TO THE TOUCH.

## 2015-12-11 NOTE — Telephone Encounter (Signed)
pls advise

## 2015-12-11 NOTE — Patient Instructions (Signed)
Breastfeeding and Mastitis Mastitis is inflammation of the breast tissue. It can occur in women who are breastfeeding. This can make breastfeeding painful. Mastitis will sometimes go away on its own. Your health care provider will help determine if treatment is needed. CAUSES Mastitis is often associated with a blocked milk (lactiferous) duct. This can happen when too much milk builds up in the breast. Causes of excess milk in the breast can include:  Poor latch-on. If your baby is not latched onto the breast properly, she or he may not empty your breast completely while breastfeeding.  Allowing too much time to pass between feedings.  Wearing a bra or other clothing that is too tight. This puts extra pressure on the lactiferous ducts so milk does not flow through them as it should. Mastitis can also be caused by a bacterial infection. Bacteria may enter the breast tissue through cuts or openings in the skin. In women who are breastfeeding, this may occur because of cracked or irritated skin. Cracks in the skin are often caused when your baby does not latch on properly to the breast. SIGNS AND SYMPTOMS  Swelling, redness, tenderness, and pain in an area of the breast.  Swelling of the glands under the arm on the same side.  Fever may or may not accompany mastitis. If an infection is allowed to progress, a collection of pus (abscess) may develop. DIAGNOSIS  Your health care provider can usually diagnose mastitis based on your symptoms and a physical exam. Tests may be done to help confirm the diagnosis. These may include:  Removal of pus from the breast by applying pressure to the area. This pus can be examined in the lab to determine which bacteria are present. If an abscess has developed, the fluid in the abscess can be removed with a needle. This can also be used to confirm the diagnosis and determine the bacteria present. In most cases, pus will not be present.  Blood tests to determine if  your body is fighting a bacterial infection.  Mammogram or ultrasound tests to rule out other problems or diseases. TREATMENT  Mastitis that occurs with breastfeeding will sometimes go away on its own. Your health care provider may choose to wait 24 hours after first seeing you to decide whether a prescription medicine is needed. If your symptoms are worse after 24 hours, your health care provider will likely prescribe an antibiotic medicine to treat the mastitis. He or she will determine which bacteria are most likely causing the infection and will then select an appropriate antibiotic medicine. This is sometimes changed based on the results of tests performed to identify the bacteria, or if there is no response to the antibiotic medicine selected. Antibiotic medicines are usually given by mouth. You may also be given medicine for pain. HOME CARE INSTRUCTIONS  Only take over-the-counter or prescription medicines for pain, fever, or discomfort as directed by your health care provider.  If your health care provider prescribed an antibiotic medicine, take the medicine as directed. Make sure you finish it even if you start to feel better.  Do not wear a tight or underwire bra. Wear a soft, supportive bra.  Increase your fluid intake, especially if you have a fever.  Continue to empty the breast. Your health care provider can tell you whether this milk is safe for your infant or needs to be thrown out. You may be told to stop nursing until your health care provider thinks it is safe for your baby.   Use a breast pump if you are advised to stop nursing.  Keep your nipples clean and dry.  Empty the first breast completely before going to the other breast. If your baby is not emptying your breasts completely for some reason, use a breast pump to empty your breasts.  If you go back to work, pump your breasts while at work to stay in time with your nursing schedule.  Avoid allowing your breasts to become  overly filled with milk (engorged). SEEK MEDICAL CARE IF:  You have pus-like discharge from the breast.  Your symptoms do not improve with the treatment prescribed by your health care provider within 2 days. SEEK IMMEDIATE MEDICAL CARE IF:  Your pain and swelling are getting worse.  You have pain that is not controlled with medicine.  You have a red line extending from the breast toward your armpit.  You have a fever or persistent symptoms for more than 2-3 days.  You have a fever and your symptoms suddenly get worse. MAKE SURE YOU:   Understand these instructions.  Will watch your condition.  Will get help right away if you are not doing well or get worse.   This information is not intended to replace advice given to you by your health care provider. Make sure you discuss any questions you have with your health care provider.   Document Released: 05/21/2004 Document Revised: 01/29/2013 Document Reviewed: 08/30/2012 Elsevier Interactive Patient Education 2016 [employer]  

## 2016-03-16 IMAGING — CR DG CHEST 2V
1 series · 2 of 2 positions shown · non-contrast
Comparison: July 16, 2010.

CLINICAL DATA: Shortness of breath.

EXAM:
CHEST  2 VIEW

[Series 1: dg chest 2 view · 0.14mm/px · 2 of 2 slices shown]
[im 1/2]
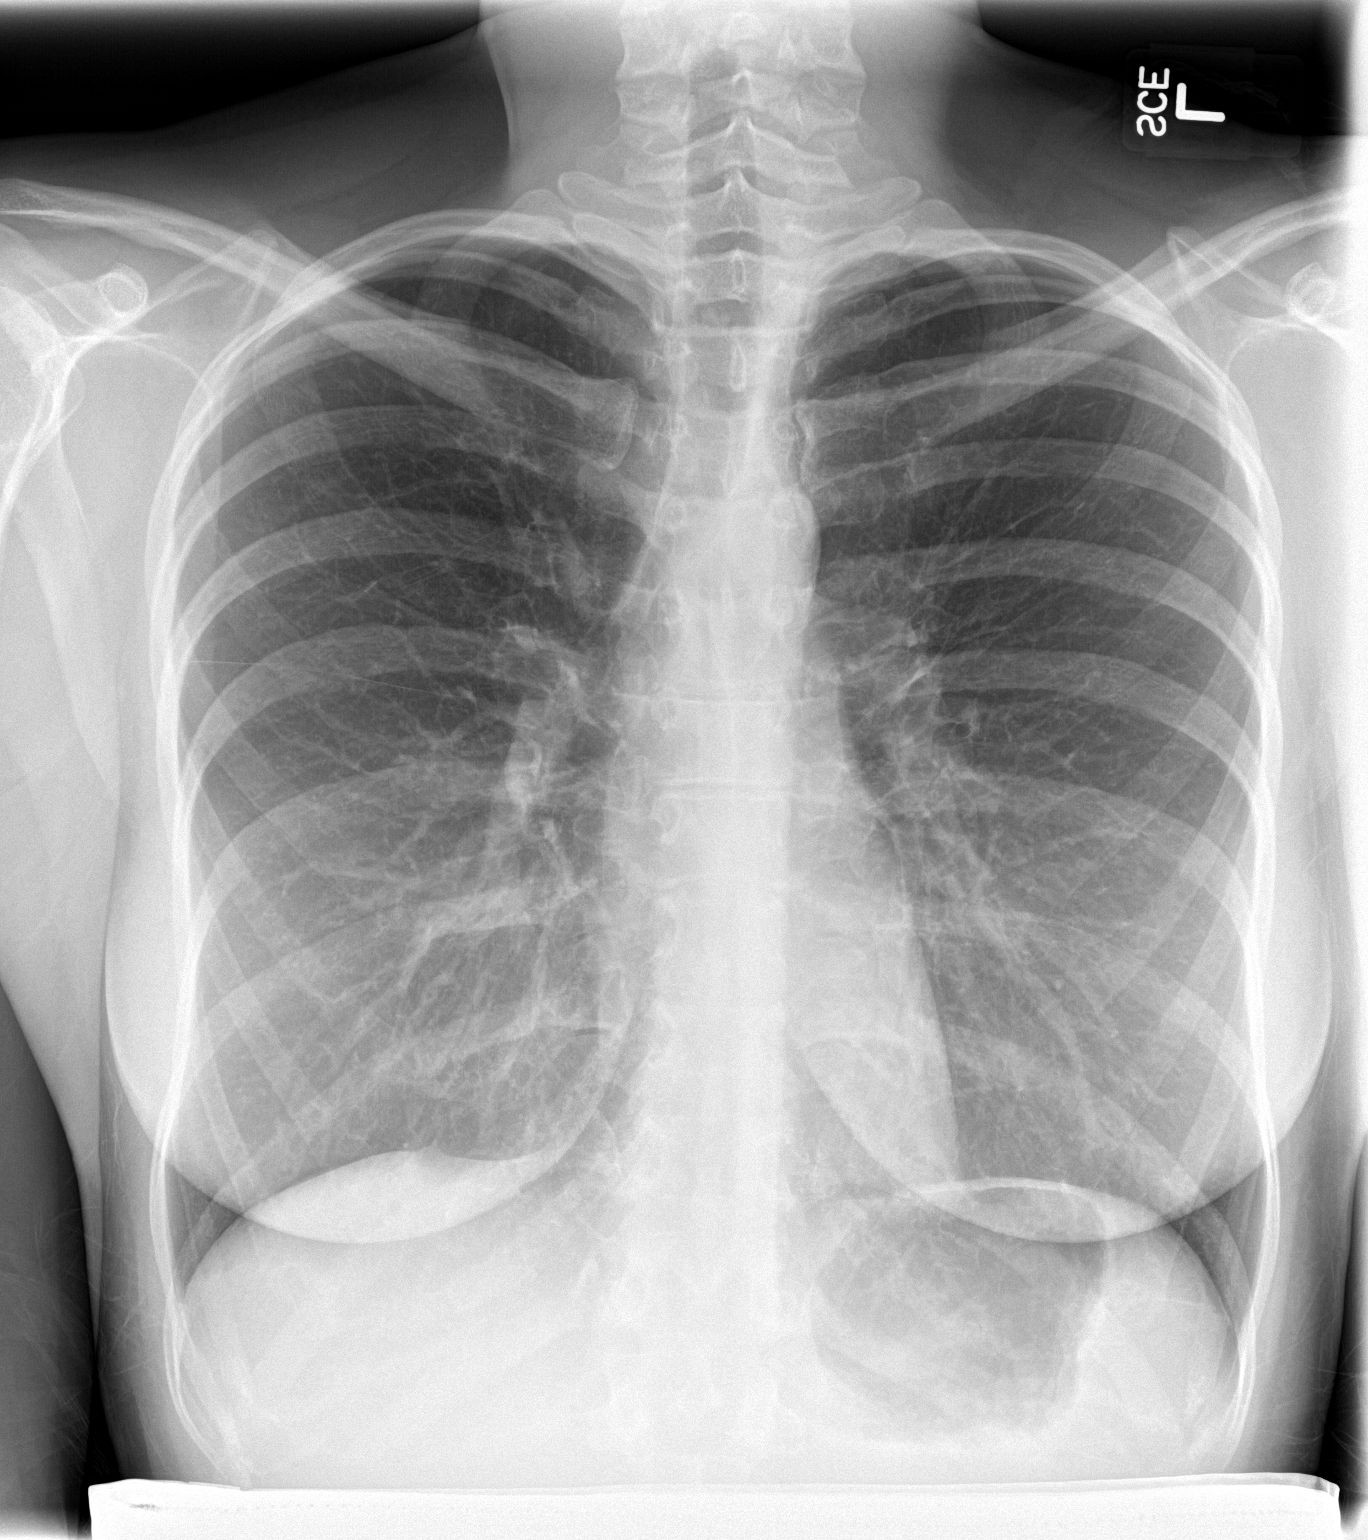
[im 2/2]
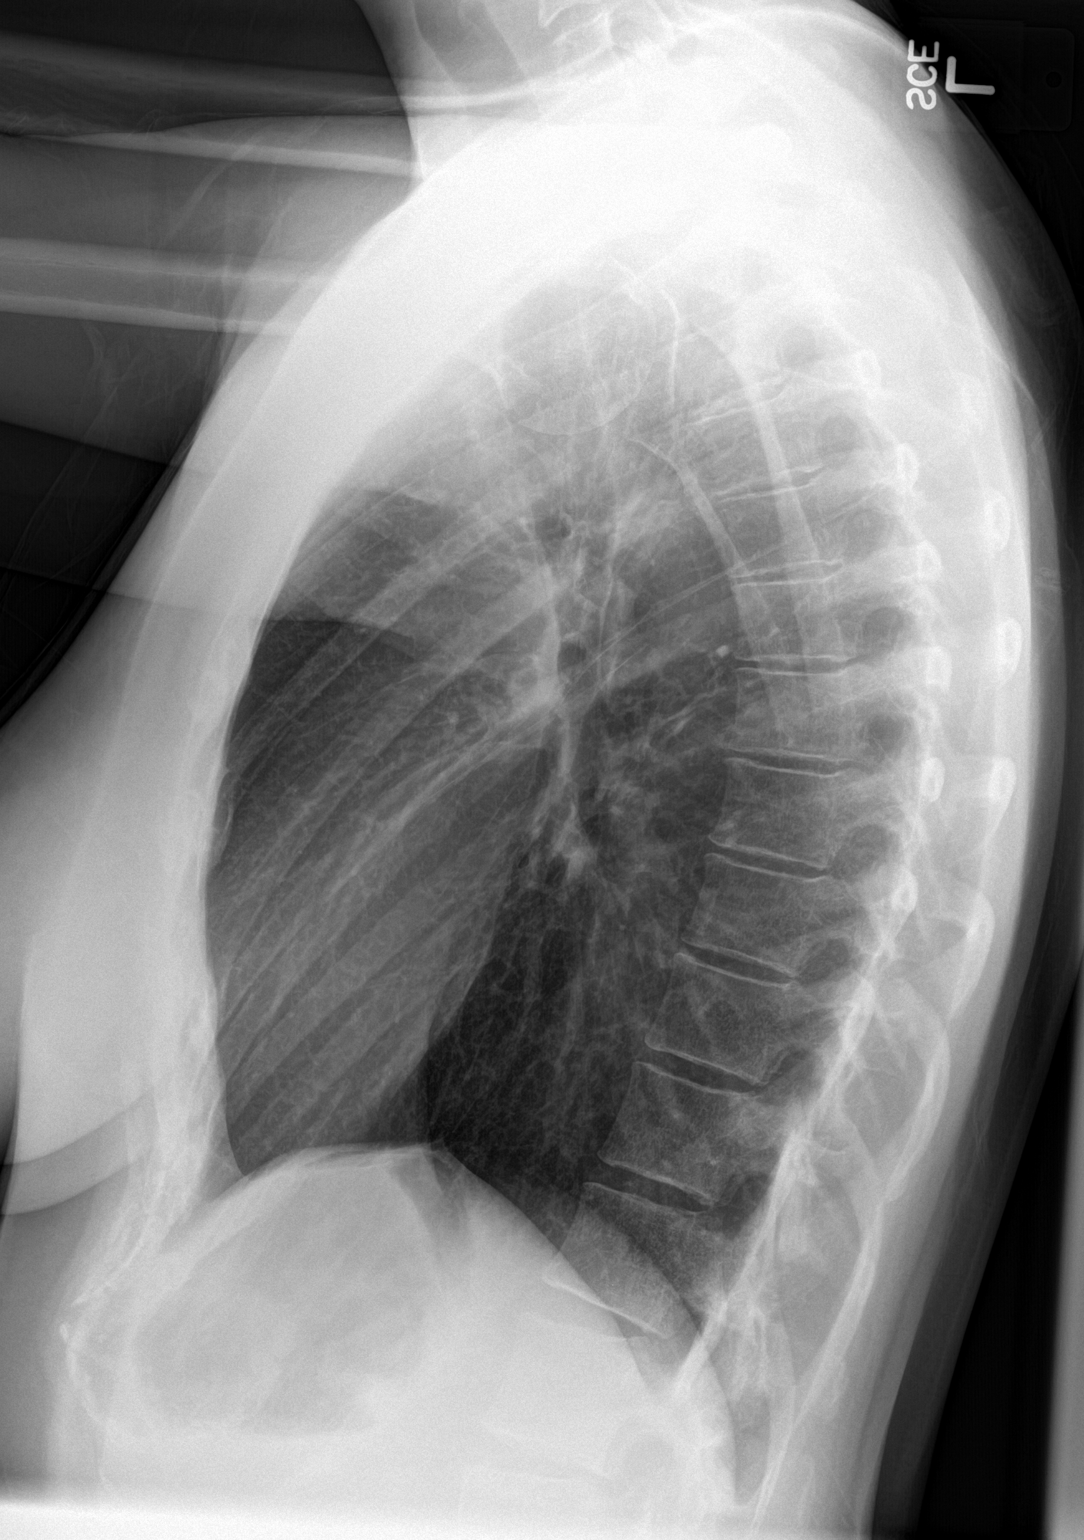

[2 of 2 positions shown; findings below may reference images not displayed]

FINDINGS: The heart size and mediastinal contours are within normal limits.
Both lungs are clear. No pneumothorax or pleural effusion is noted.
The visualized skeletal structures are unremarkable.
IMPRESSION: No active cardiopulmonary disease.

## 2016-03-22 ENCOUNTER — Encounter: Payer: Medicaid Other | Admitting: Obstetrics and Gynecology

## 2016-03-23 ENCOUNTER — Other Ambulatory Visit: Payer: Self-pay | Admitting: *Deleted

## 2016-03-23 ENCOUNTER — Telehealth: Payer: Self-pay | Admitting: Obstetrics and Gynecology

## 2016-03-23 MED ORDER — DICLOXACILLIN SODIUM 500 MG PO CAPS: 500.0000 mg | ORAL_CAPSULE | Freq: Four times a day (QID) | ORAL | 1 refills | Status: DC

## 2016-03-23 NOTE — Telephone Encounter (Signed)
Done-ac 

## 2016-03-23 NOTE — Telephone Encounter (Signed)
PT CALLED AND HAS MASTITIS AGAIN AND WANTED TO SEE IF THE SAME MEDICATION COULD BE CALLED IN THAT WAS CALLED IN LAST TIME.

## 2016-04-08 ENCOUNTER — Ambulatory Visit (INDEPENDENT_AMBULATORY_CARE_PROVIDER_SITE_OTHER): Payer: Medicaid Other | Admitting: Obstetrics and Gynecology

## 2016-04-08 ENCOUNTER — Encounter: Payer: Self-pay | Admitting: Obstetrics and Gynecology

## 2016-04-08 VITALS — BP 118/78 | HR 87 | Ht 69.0 in | Wt 140.0 lb

## 2016-04-08 DIAGNOSIS — R5383 Other fatigue: Secondary | ICD-10-CM | POA: Diagnosis not present

## 2016-04-08 NOTE — Progress Notes (Signed)
Subjective:     Patient ID: Joann Fitzpatrick, female   DOB: 09-23-1990, 26 y.o.   MRN: [id number]  HPI Extremely fatigued for last month, had 13 # weight gain in same time. Staying active at work. Not over eating. Milk supply stopped all of a sudden 2 weeks ago. Falling asleep fine but has frequently awakens through the night and does fall back asleep. Working part time as Educational psychologist (lunch shift). Denies any depression or anxiety. Had first menses last month and due for next one soon. Concerned as has a family history of thyroid disease.  Review of Systems Negative except stated above.    Objective:   Physical Exam  A&O x4  well groomed female in no distress Blood pressure 118/78, pulse 87, height '5\' 9"'  (1.753 m), weight 140 lb (63.5 kg), last menstrual period 03/15/2016, currently breastfeeding. HRR, lungs clear Thyroid normal on exam No edema noted in extremities Pelvic exam not indicated.     Assessment:     Fatigue Weight changes     Plan:     Labs obtained, will follow up accordingly. Reassured of normal findings on exam.   Melody Gayla [place], CNM

## 2016-04-09 LAB — THYROID PANEL WITH TSH
FREE THYROXINE INDEX: 1.8 (ref 1.2–4.9)
T3 Uptake Ratio: 24 % (ref 24–39)
T4, Total: 7.5 ug/dL (ref 4.5–12.0)
TSH: 0.86 u[IU]/mL (ref 0.450–4.500)

## 2016-04-09 LAB — COMPREHENSIVE METABOLIC PANEL
A/G RATIO: 1.7 (ref 1.2–2.2)
ALT: 10 IU/L (ref 0–32)
AST: 17 IU/L (ref 0–40)
Albumin: 5.2 g/dL (ref 3.5–5.5)
Alkaline Phosphatase: 88 IU/L (ref 39–117)
BUN/Creatinine Ratio: 17 (ref 9–23)
BUN: 11 mg/dL (ref 6–20)
Bilirubin Total: 0.8 mg/dL (ref 0.0–1.2)
CALCIUM: 9.7 mg/dL (ref 8.7–10.2)
CO2: 20 mmol/L (ref 18–29)
CREATININE: 0.63 mg/dL (ref 0.57–1.00)
Chloride: 102 mmol/L (ref 96–106)
GFR, EST AFRICAN AMERICAN: 143 mL/min/{1.73_m2} (ref >59)
GFR, EST NON AFRICAN AMERICAN: 124 mL/min/{1.73_m2} (ref >59)
Globulin, Total: 3 g/dL (ref 1.5–4.5)
Glucose: 95 mg/dL (ref 65–99)
POTASSIUM: 4.1 mmol/L (ref 3.5–5.2)
Sodium: 141 mmol/L (ref 134–144)
TOTAL PROTEIN: 8.2 g/dL (ref 6.0–8.5)

## 2016-04-09 LAB — CBC
HEMOGLOBIN: 14 g/dL (ref 11.1–15.9)
Hematocrit: 40.5 % (ref 34.0–46.6)
MCH: 29.9 pg (ref 26.6–33.0)
MCHC: 34.6 g/dL (ref 31.5–35.7)
MCV: 86 fL (ref 79–97)
PLATELETS: 305 10*3/uL (ref 150–379)
RBC: 4.69 x10E6/uL (ref 3.77–5.28)
RDW: 13.3 % (ref 12.3–15.4)
WBC: 6.3 10*3/uL (ref 3.4–10.8)

## 2016-04-09 LAB — B12 AND FOLATE PANEL
FOLATE: 15.3 ng/mL (ref >3.0)
Vitamin B-12: 345 pg/mL (ref 232–1245)

## 2016-04-09 LAB — VITAMIN D 25 HYDROXY (VIT D DEFICIENCY, FRACTURES): Vit D, 25-Hydroxy: 27.6 ng/mL — ABNORMAL LOW (ref 30.0–100.0)

## 2016-04-09 LAB — FERRITIN: FERRITIN: 19 ng/mL (ref 15–150)

## 2016-04-09 LAB — MAGNESIUM: MAGNESIUM: 2.1 mg/dL (ref 1.6–2.3)

## 2016-05-19 ENCOUNTER — Encounter: Payer: Medicaid Other | Admitting: Obstetrics and Gynecology

## 2016-09-23 ENCOUNTER — Encounter: Payer: Self-pay | Admitting: Obstetrics and Gynecology

## 2016-09-23 ENCOUNTER — Ambulatory Visit (INDEPENDENT_AMBULATORY_CARE_PROVIDER_SITE_OTHER): Payer: 59 | Admitting: Obstetrics and Gynecology

## 2016-09-23 VITALS — BP 118/78 | HR 98 | Ht 69.0 in | Wt 151.9 lb

## 2016-09-23 DIAGNOSIS — B379 Candidiasis, unspecified: Secondary | ICD-10-CM

## 2016-09-23 DIAGNOSIS — R300 Dysuria: Secondary | ICD-10-CM | POA: Diagnosis not present

## 2016-09-23 MED ORDER — FLUCONAZOLE 150 MG PO TABS: 150.0000 mg | ORAL_TABLET | Freq: Once | ORAL | 3 refills | Status: AC

## 2016-09-23 NOTE — Progress Notes (Signed)
Subjective:     Patient ID: Joann Fitzpatrick, female   DOB: Mar 08, 1990, 26 y.o.   MRN: [id number]  HPI  Reports feeling urinary urgency and burning x 5 days, took AZO x 3 days and symptoms improved, now notices vaginal itching. Scant discharge. Denies fever or bowel changes or vaginal bleeding. Menses have been normal since tubal.      Review of Systems Negative except stated above in HPI.    Objective:   Physical Exam A&Ox4 Well groomed female in no distress Blood pressure 118/78, pulse 98, height 5\' 9"  (1.753 m), weight 151 lb 14.4 oz (68.9 kg), last menstrual period 09/01/2016. Pelvic exam: normal external genitalia, vulva, vagina, cervix, uterus and adnexa, WET MOUNT done - results: negative for pathogens, normal epithelial cells, lactobacilli.   Urinalysis    Component Value Date/Time   BILIRUBINUR negative 09/29/2015 1132   PROTEINUR negative 09/29/2015 1132   UROBILINOGEN negative 09/29/2015 1132   NITRITE negative 09/29/2015 1132   LEUKOCYTESUR moderate (2+) (A) 09/29/2015 1132       Assessment:     Dysuria Yeast infection    Plan:     Will send urine for culture and follow up accordingly RX for diflucan sent in and instructed on use. RTC as needed.  Mekenna Finau [place], CNM

## 2017-01-13 ENCOUNTER — Telehealth: Payer: Self-pay | Admitting: Obstetrics and Gynecology

## 2017-01-13 NOTE — Telephone Encounter (Signed)
The patient called and stated that she would like for Amy to call her in regards to her menstrual problem she is struggling with. Please advise.

## 2017-09-27 DIAGNOSIS — F419 Anxiety disorder, unspecified: Secondary | ICD-10-CM | POA: Diagnosis not present

## 2017-09-27 DIAGNOSIS — N920 Excessive and frequent menstruation with regular cycle: Secondary | ICD-10-CM | POA: Diagnosis not present

## 2017-10-18 DIAGNOSIS — N920 Excessive and frequent menstruation with regular cycle: Secondary | ICD-10-CM | POA: Diagnosis not present

## 2017-11-06 DIAGNOSIS — N920 Excessive and frequent menstruation with regular cycle: Secondary | ICD-10-CM | POA: Diagnosis not present

## 2017-11-06 DIAGNOSIS — Z3202 Encounter for pregnancy test, result negative: Secondary | ICD-10-CM | POA: Diagnosis not present

## 2017-12-20 DIAGNOSIS — N920 Excessive and frequent menstruation with regular cycle: Secondary | ICD-10-CM | POA: Diagnosis not present

## 2017-12-20 DIAGNOSIS — R232 Flushing: Secondary | ICD-10-CM | POA: Diagnosis not present

## 2017-12-21 ENCOUNTER — Encounter: Payer: Self-pay | Admitting: Emergency Medicine

## 2017-12-21 ENCOUNTER — Other Ambulatory Visit: Payer: Self-pay

## 2017-12-21 ENCOUNTER — Emergency Department
Admission: EM | Admit: 2017-12-21 | Discharge: 2017-12-21 | Disposition: A | Discharge status: Home / Self Care | Payer: BLUE CROSS/BLUE SHIELD | Attending: Emergency Medicine | Admitting: Emergency Medicine

## 2017-12-21 DIAGNOSIS — R824 Acetonuria: Secondary | ICD-10-CM | POA: Insufficient documentation

## 2017-12-21 DIAGNOSIS — R42 Dizziness and giddiness: Secondary | ICD-10-CM

## 2017-12-21 DIAGNOSIS — I951 Orthostatic hypotension: Secondary | ICD-10-CM | POA: Insufficient documentation

## 2017-12-21 DIAGNOSIS — R630 Anorexia: Secondary | ICD-10-CM | POA: Diagnosis not present

## 2017-12-21 DIAGNOSIS — R110 Nausea: Secondary | ICD-10-CM | POA: Diagnosis not present

## 2017-12-21 DIAGNOSIS — R55 Syncope and collapse: Secondary | ICD-10-CM | POA: Diagnosis not present

## 2017-12-21 LAB — URINALYSIS, COMPLETE (UACMP) WITH MICROSCOPIC
Bacteria, UA: NONE SEEN
Bilirubin Urine: NEGATIVE
GLUCOSE, UA: NEGATIVE mg/dL
Hgb urine dipstick: NEGATIVE
Ketones, ur: 20 mg/dL — AB
Nitrite: NEGATIVE
PH: 5 (ref 5.0–8.0)
Protein, ur: NEGATIVE mg/dL
Specific Gravity, Urine: 1.028 (ref 1.005–1.030)

## 2017-12-21 LAB — COMPREHENSIVE METABOLIC PANEL
ALBUMIN: 4.3 g/dL (ref 3.5–5.0)
ALK PHOS: 41 U/L (ref 38–126)
ALT: 10 U/L (ref 0–44)
AST: 23 U/L (ref 15–41)
Anion gap: 10 (ref 5–15)
BUN: 13 mg/dL (ref 6–20)
CALCIUM: 8.7 mg/dL — AB (ref 8.9–10.3)
CO2: 23 mmol/L (ref 22–32)
Chloride: 108 mmol/L (ref 98–111)
Creatinine, Ser: 0.41 mg/dL — ABNORMAL LOW (ref 0.44–1.00)
GFR calc Af Amer: >60 mL/min (ref >60)
GFR calc non Af Amer: >60 mL/min (ref >60)
GLUCOSE: 83 mg/dL (ref 70–99)
Potassium: 4.4 mmol/L (ref 3.5–5.1)
Sodium: 141 mmol/L (ref 135–145)
Total Bilirubin: 1.3 mg/dL — ABNORMAL HIGH (ref 0.3–1.2)
Total Protein: 6.9 g/dL (ref 6.5–8.1)

## 2017-12-21 LAB — CBC WITH DIFFERENTIAL/PLATELET
Abs Immature Granulocytes: 0.02 10*3/uL (ref 0.00–0.07)
BASOS PCT: 1 %
Basophils Absolute: 0.1 10*3/uL (ref 0.0–0.1)
EOS ABS: 0 10*3/uL (ref 0.0–0.5)
EOS PCT: 1 %
HCT: 43.2 % (ref 36.0–46.0)
Hemoglobin: 15 g/dL (ref 12.0–15.0)
Immature Granulocytes: 0 %
Lymphocytes Relative: 48 %
Lymphs Abs: 3.4 10*3/uL (ref 0.7–4.0)
MCH: 30.4 pg (ref 26.0–34.0)
MCHC: 34.7 g/dL (ref 30.0–36.0)
MCV: 87.4 fL (ref 80.0–100.0)
MONO ABS: 0.5 10*3/uL (ref 0.1–1.0)
Monocytes Relative: 7 %
NEUTROS ABS: 3 10*3/uL (ref 1.7–7.7)
Neutrophils Relative %: 43 %
PLATELETS: 343 10*3/uL (ref 150–400)
RBC: 4.94 MIL/uL (ref 3.87–5.11)
RDW: 11.9 % (ref 11.5–15.5)
WBC: 7 10*3/uL (ref 4.0–10.5)
nRBC: 0 % (ref 0.0–0.2)

## 2017-12-21 LAB — POCT PREGNANCY, URINE: Preg Test, Ur: NEGATIVE

## 2017-12-21 LAB — LIPASE, BLOOD: Lipase: 33 U/L (ref 11–51)

## 2017-12-21 LAB — T4, FREE: Free T4: 1.09 ng/dL (ref 0.82–1.77)

## 2017-12-21 LAB — TSH: TSH: 0.34 u[IU]/mL — ABNORMAL LOW (ref 0.350–4.500)

## 2017-12-21 MED ORDER — ONDANSETRON HCL 4 MG/2ML IJ SOLN
4.0000 mg | Freq: Once | INTRAMUSCULAR | Status: AC
  Administered 2017-12-21: 4 mg via INTRAVENOUS
  Filled 2017-12-21: qty 2

## 2017-12-21 MED ORDER — SODIUM CHLORIDE 0.9 % IV BOLUS
1000.0000 mL | Freq: Once | INTRAVENOUS | Status: AC
  Administered 2017-12-21: 1000 mL via INTRAVENOUS

## 2017-12-21 MED ORDER — ONDANSETRON 4 MG PO TBDP: 4.0000 mg | ORAL_TABLET | Freq: Three times a day (TID) | ORAL | 0 refills | Status: AC | PRN

## 2017-12-21 NOTE — ED Notes (Signed)
Pt unable to void at this time, given urine cup and will continue to monitor

## 2017-12-21 NOTE — ED Provider Notes (Signed)
[facility] Emergency Department Provider Note  ____________________________________________  Time seen: Approximately 11:39 AM  I have reviewed the triage vital signs and the nursing notes.   HISTORY  Chief Complaint Nausea   HPI Joann Fitzpatrick is a 27 y.o. female with a history of iron deficiency anemia, IBS who presents for evaluation of near syncope.  Patient reports that she has not felt well over the last week.  She has been very nauseated, has not had an appetite, has lost 11 pounds.  She has been having daily migraine headaches.  She does have a history of migraine headaches.  These headaches are identical to her chronic migraine headaches.  She currently has a mild one.  No thunderclap headache or blurry vision.  No abdominal pain, vomiting, diarrhea, constipation, chest pain, shortness of breath. This morning she was in the shower when she started feeling lightheaded like she was going to pass out.  That made her concerned which prompted her visit to the urgent care.  She was found to be orthostatic and was sent to the emergency room for IV fluids.  Of note patient had a uterine ablation done 10 days ago for abnormal uterine bleeding.  She had a follow-up with her OB/GYN 2 days ago and had some labs done for the same complaint but she has not had any results back yet. She has a strong family history of Addison's disease.   Past Medical History:  Diagnosis Date  . History of anemia   . History of chicken pox   . History of IBS   . Hx of iron deficiency anemia   . IBS (irritable bowel syndrome)   . Pregnancy related nausea, antepartum   . Pregnancy with uncertain dates   . 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 LAPAROSCOPY    . TONSILLECTOMY    . TUBAL LIGATION Bilateral 10/06/2015   Procedure: POST PARTUM TUBAL LIGATION;  Surgeon: Herold HarmsMartin A Defrancesco, MD;   Location: [facility] ORS;  Service: Gynecology;  Laterality: Bilateral;    Prior to Admission medications   Medication Sig Start Date End Date Taking? Authorizing Provider  ondansetron (ZOFRAN ODT) 4 MG disintegrating tablet Take 1 tablet (4 mg total) by mouth every 8 (eight) hours as needed for nausea or vomiting. 12/21/17   Nita SickleVeronese, [place], MD    Allergies Oxycodone-acetaminophen  Family History  Problem Relation Age of Onset  . Renal cancer Other        aunt  . Asthma Other        grandparent  . Emphysema Other        grandparent  . Thyroid nodules Other        grandparent    Social History Social History   Tobacco Use  . Smoking status: Never Smoker  . Smokeless tobacco: Never Used  Substance Use Topics  . Alcohol use: No    Alcohol/week: 0.0 standard drinks  . Drug use: No    Review of Systems  Constitutional: Negative for fever. + near syncope Eyes: Negative for visual changes. ENT: Negative for sore throat. Neck: No neck pain  Cardiovascular: Negative for chest pain. Respiratory: Negative for shortness of breath. Gastrointestinal: Negative for abdominal pain, vomiting or diarrhea. + anorexia and weight loss Genitourinary: Negative for dysuria. Musculoskeletal: Negative for back pain. Skin: Negative for rash. Neurological: Negative for headaches, weakness or numbness. Psych: No SI or HI  ____________________________________________   PHYSICAL EXAM:  VITAL SIGNS: ED Triage Vitals  Enc Vitals Group     BP 12/21/17 1051 125/87     Pulse Rate 12/21/17 1051 68     Resp 12/21/17 1051 18     Temp 12/21/17 1051 98.6 F (37 C)     Temp Source 12/21/17 1051 Oral     SpO2 12/21/17 1051 100 %     Weight 12/21/17 1052 138 lb (62.6 kg)     Height 12/21/17 1052 5\' 9"  (1.753 m)     Head Circumference --      Peak Flow --      Pain Score 12/21/17 1051 0     Pain Loc --      Pain Edu? --      Excl. in GC? --     Constitutional: Alert and oriented. Well  appearing and in no apparent distress. HEENT:      Head: Normocephalic and atraumatic.         Eyes: Conjunctivae are normal. Sclera is non-icteric.       Mouth/Throat: Mucous membranes are moist.       Neck: Supple with no signs of meningismus. Cardiovascular: Regular rate and rhythm. No murmurs, gallops, or rubs. 2+ symmetrical distal pulses are present in all extremities. No JVD. Respiratory: Normal respiratory effort. Lungs are clear to auscultation bilaterally. No wheezes, crackles, or rhonchi.  Gastrointestinal: Soft, non tender, and non distended with positive bowel sounds. No rebound or guarding. Musculoskeletal: Nontender with normal range of motion in all extremities. No edema, cyanosis, or erythema of extremities. Neurologic: Normal speech and language. Face is symmetric. Moving all extremities. No gross focal neurologic deficits are appreciated. Skin: Skin is warm, dry and intact. No rash noted. Psychiatric: Mood and affect are normal. Speech and behavior are normal.  ____________________________________________   LABS (all labs ordered are listed, but only abnormal results are displayed)  Labs Reviewed  URINALYSIS, COMPLETE (UACMP) WITH MICROSCOPIC - Abnormal; Notable for the following components:      Result Value   Color, Urine YELLOW (*)    APPearance CLOUDY (*)    Ketones, ur 20 (*)    Leukocytes, UA SMALL (*)    All other components within normal limits  TSH - Abnormal; Notable for the following components:   TSH 0.340 (*)    All other components within normal limits  COMPREHENSIVE METABOLIC PANEL - Abnormal; Notable for the following components:   Creatinine, Ser 0.41 (*)    Calcium 8.7 (*)    Total Bilirubin 1.3 (*)    All other components within normal limits  URINE CULTURE  CBC WITH DIFFERENTIAL/PLATELET  T4, FREE  LIPASE, BLOOD  POCT PREGNANCY, URINE   ____________________________________________  EKG  ED ECG REPORT I, Nita Sickle, the  attending physician, personally viewed and interpreted this ECG.  Normal sinus rhythm, rate of 81, normal intervals, normal axis, no ST elevations or depressions. Normal EKG ____________________________________________  RADIOLOGY  none  ____________________________________________   PROCEDURES  Procedure(s) performed: None Procedures Critical Care performed:  None ____________________________________________   INITIAL IMPRESSION / ASSESSMENT AND PLAN / ED COURSE   27 y.o. female with a history of iron deficiency anemia, IBS who presents for evaluation of near syncope in the setting of 1 week of anorexia, 11lb weight loss, and constant nausea.  Patient is well-appearing and in no distress, she is orthostatic on exam, looks mildly dry, her physical exam is otherwise nonfocal with normal neurological exam, soft nontender  abdomen.  Will check labs to rule out worsening anemia, electrolyte abnormalities, AKI with severe dehydration, urinary tract infection, pregnancy, or thyroid abnormalities.  Will give IV fluids and Zofran. EKG with no evidence of ischemia or dysrhythmias.  Will monitor on telemetry.    _________________________ 1:31 PM on 12/21/2017 -----------------------------------------  Labs with no acute abnormalities.  Urine with mild ketones.  Patient was given fluids and Zofran.  She reports feeling markedly improved.  She is having a grilled cheese sandwich and reports this is the first time in a week that she actually felt hungry.  I will provide her with a prescription for Zofran.  Recommended follow-up with her primary care doctor for further evaluation.  Discussed standard return precautions.   As part of my medical decision making, I reviewed the following data within the electronic MEDICAL RECORD NUMBER History obtained from family, Nursing notes reviewed and incorporated, Labs reviewed , EKG interpreted , Old chart reviewed, Notes from prior ED visits and [place] Controlled  Substance Database    Pertinent labs & imaging results that were available during my care of the patient were reviewed by me and considered in my medical decision making (see chart for details).    ____________________________________________   FINAL CLINICAL IMPRESSION(S) / ED DIAGNOSES  Final diagnoses:  Nausea  Anorexia  Orthostatic dizziness      NEW MEDICATIONS STARTED DURING THIS VISIT:  ED Discharge Orders         Ordered    ondansetron (ZOFRAN ODT) 4 MG disintegrating tablet  Every 8 hours PRN     12/21/17 1319           Note:  This document was prepared using Dragon voice recognition software and may include unintentional dictation errors.    Nita Sickle, MD 12/21/17 1332

## 2017-12-21 NOTE — ED Triage Notes (Signed)
First RN Note: Pt c/o dizziness, nausea, and near syncope. [facility] Pt is A&O x4, speaking in complete sentences, skin warm, dry, and intact at this time.

## 2017-12-21 NOTE — ED Triage Notes (Signed)
Pt arrives with complaints of nausea and weakness for the last week. Pt denies any pain or vomiting.

## 2017-12-21 NOTE — Discharge Instructions (Signed)
Follow up with your doctor for further evaluation.  Increase fluid intake to prevent dehydration.  Return to the emergency room for abdominal pain, chest pain, shortness of breath, fever, or any new symptoms concerning to you.

## 2017-12-23 LAB — URINE CULTURE: CULTURE: NO GROWTH

## 2018-01-16 DIAGNOSIS — S9782XA Crushing injury of left foot, initial encounter: Secondary | ICD-10-CM | POA: Diagnosis not present

## 2018-04-06 DIAGNOSIS — L299 Pruritus, unspecified: Secondary | ICD-10-CM | POA: Diagnosis not present

## 2018-04-06 DIAGNOSIS — R634 Abnormal weight loss: Secondary | ICD-10-CM | POA: Diagnosis not present

## 2018-04-06 DIAGNOSIS — S0086XA Insect bite (nonvenomous) of other part of head, initial encounter: Secondary | ICD-10-CM | POA: Diagnosis not present

## 2018-04-06 DIAGNOSIS — W57XXXA Bitten or stung by nonvenomous insect and other nonvenomous arthropods, initial encounter: Secondary | ICD-10-CM | POA: Diagnosis not present

## 2018-04-06 DIAGNOSIS — R1013 Epigastric pain: Secondary | ICD-10-CM | POA: Diagnosis not present

## 2018-04-06 DIAGNOSIS — L03116 Cellulitis of left lower limb: Secondary | ICD-10-CM | POA: Diagnosis not present

## 2018-06-15 DIAGNOSIS — B373 Candidiasis of vulva and vagina: Secondary | ICD-10-CM | POA: Diagnosis not present

## 2018-06-15 DIAGNOSIS — N762 Acute vulvitis: Secondary | ICD-10-CM | POA: Diagnosis not present

## 2018-10-19 DIAGNOSIS — Z03818 Encounter for observation for suspected exposure to other biological agents ruled out: Secondary | ICD-10-CM | POA: Diagnosis not present

## 2018-10-19 DIAGNOSIS — B373 Candidiasis of vulva and vagina: Secondary | ICD-10-CM | POA: Diagnosis not present

## 2018-10-19 DIAGNOSIS — J0100 Acute maxillary sinusitis, unspecified: Secondary | ICD-10-CM | POA: Diagnosis not present

## 2018-10-23 DIAGNOSIS — R768 Other specified abnormal immunological findings in serum: Secondary | ICD-10-CM | POA: Diagnosis not present

## 2018-10-23 DIAGNOSIS — R509 Fever, unspecified: Secondary | ICD-10-CM | POA: Diagnosis not present

## 2018-10-23 DIAGNOSIS — R5383 Other fatigue: Secondary | ICD-10-CM | POA: Diagnosis not present

## 2018-10-23 DIAGNOSIS — J0100 Acute maxillary sinusitis, unspecified: Secondary | ICD-10-CM | POA: Diagnosis not present

## 2018-10-30 DIAGNOSIS — R5382 Chronic fatigue, unspecified: Secondary | ICD-10-CM | POA: Diagnosis not present

## 2018-10-30 DIAGNOSIS — H16229 Keratoconjunctivitis sicca, not specified as Sjogren's, unspecified eye: Secondary | ICD-10-CM | POA: Insufficient documentation

## 2018-10-30 DIAGNOSIS — H16223 Keratoconjunctivitis sicca, not specified as Sjogren's, bilateral: Secondary | ICD-10-CM | POA: Diagnosis not present

## 2018-10-30 DIAGNOSIS — Z114 Encounter for screening for human immunodeficiency virus [HIV]: Secondary | ICD-10-CM | POA: Diagnosis not present

## 2018-10-30 DIAGNOSIS — E538 Deficiency of other specified B group vitamins: Secondary | ICD-10-CM | POA: Diagnosis not present

## 2018-10-30 DIAGNOSIS — R768 Other specified abnormal immunological findings in serum: Secondary | ICD-10-CM | POA: Diagnosis not present

## 2018-10-30 DIAGNOSIS — Z13820 Encounter for screening for osteoporosis: Secondary | ICD-10-CM | POA: Insufficient documentation

## 2018-11-20 DIAGNOSIS — E538 Deficiency of other specified B group vitamins: Secondary | ICD-10-CM | POA: Diagnosis not present

## 2018-11-20 DIAGNOSIS — H16223 Keratoconjunctivitis sicca, not specified as Sjogren's, bilateral: Secondary | ICD-10-CM | POA: Diagnosis not present

## 2018-11-20 DIAGNOSIS — R682 Dry mouth, unspecified: Secondary | ICD-10-CM | POA: Diagnosis not present

## 2018-11-20 DIAGNOSIS — R5382 Chronic fatigue, unspecified: Secondary | ICD-10-CM | POA: Diagnosis not present

## 2018-12-19 DIAGNOSIS — J309 Allergic rhinitis, unspecified: Secondary | ICD-10-CM | POA: Diagnosis not present

## 2018-12-19 DIAGNOSIS — R682 Dry mouth, unspecified: Secondary | ICD-10-CM | POA: Diagnosis not present

## 2018-12-28 DIAGNOSIS — M3501 Sicca syndrome with keratoconjunctivitis: Secondary | ICD-10-CM | POA: Diagnosis not present

## 2018-12-28 DIAGNOSIS — K118 Other diseases of salivary glands: Secondary | ICD-10-CM | POA: Diagnosis not present

## 2019-01-23 DIAGNOSIS — R5382 Chronic fatigue, unspecified: Secondary | ICD-10-CM | POA: Diagnosis not present

## 2019-01-23 DIAGNOSIS — E538 Deficiency of other specified B group vitamins: Secondary | ICD-10-CM | POA: Diagnosis not present

## 2019-01-23 DIAGNOSIS — H16223 Keratoconjunctivitis sicca, not specified as Sjogren's, bilateral: Secondary | ICD-10-CM | POA: Diagnosis not present

## 2020-09-03 DIAGNOSIS — Z862 Personal history of diseases of the blood and blood-forming organs and certain disorders involving the immune mechanism: Secondary | ICD-10-CM | POA: Insufficient documentation

## 2020-09-03 DIAGNOSIS — N946 Dysmenorrhea, unspecified: Secondary | ICD-10-CM | POA: Insufficient documentation

## 2020-09-03 DIAGNOSIS — N920 Excessive and frequent menstruation with regular cycle: Secondary | ICD-10-CM | POA: Insufficient documentation

## 2020-09-03 DIAGNOSIS — K589 Irritable bowel syndrome without diarrhea: Secondary | ICD-10-CM | POA: Insufficient documentation

## 2020-09-03 DIAGNOSIS — F419 Anxiety disorder, unspecified: Secondary | ICD-10-CM | POA: Insufficient documentation

## 2021-09-26 ENCOUNTER — Ambulatory Visit
Admission: EM | Admit: 2021-09-26 | Discharge: 2021-09-26 | Disposition: A | Discharge status: Home / Self Care | Payer: BC Managed Care – PPO | Attending: Emergency Medicine | Admitting: Emergency Medicine

## 2021-09-26 ENCOUNTER — Ambulatory Visit: Admit: 2021-09-26 | Discharge status: Absent without leave | Payer: BLUE CROSS/BLUE SHIELD

## 2021-09-26 DIAGNOSIS — B029 Zoster without complications: Secondary | ICD-10-CM

## 2021-09-26 MED ORDER — VALACYCLOVIR HCL 1 G PO TABS: 1000.0000 mg | ORAL_TABLET | Freq: Three times a day (TID) | ORAL | 0 refills | Status: AC

## 2021-09-26 NOTE — ED Triage Notes (Signed)
Patient presents to Urgent Care with complaints of a rash possible shingles. Rash localized to her left axillary side noted yesterday. Not taking anything for pain.

## 2021-09-26 NOTE — ED Provider Notes (Signed)
Renaldo Fiddler    CSN: [id number] Arrival date & time: 09/26/21  1016      History   Chief Complaint Chief Complaint  Patient presents with   Rash    HPI Joann Fitzpatrick is a 31 y.o. female.   Patient presents with rash to the left flank beginning 1 day ago.  Rash is painful and pruritic.  Pain is described as a burning sensation and soreness causing interference with sleeping.  Has not attempted treatment of symptoms.  Denies a change in soaps, lotions detergents or cosmetic products, dietary changes or exposure to wooded or plant areas.  No other members of household has similar rash.  Denies drainage, fever or chills.  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   Past Surgical History:  Procedure Laterality Date   LAPAROSCOPY     TONSILLECTOMY     TUBAL LIGATION Bilateral 10/06/2015   Procedure: POST PARTUM TUBAL LIGATION;  Surgeon: Herold Harms, MD;  Location: [facility] ORS;  Service: Gynecology;  Laterality: Bilateral;    OB History     Gravida  2   Para  2   Term  2   Preterm      AB      Living  2      SAB      IAB      Ectopic      Multiple  0   Live Births  2            Home Medications    Prior to Admission medications   Medication Sig Start Date End Date Taking? Authorizing Provider  valACYclovir (VALTREX) 1000 MG tablet Take 1 tablet (1,000 mg total) by mouth 3 (three) times daily. 09/26/21  Yes Alexios Keown R, NP  ondansetron (ZOFRAN ODT) 4 MG disintegrating tablet Take 1 tablet (4 mg total) by mouth every 8 (eight) hours as needed for nausea or vomiting. 12/21/17   Nita Sickle, MD     Family History Family History  Problem Relation Age of Onset   Renal cancer Other        aunt   Asthma Other        grandparent   Emphysema Other        grandparent   Thyroid nodules Other        grandparent    Social History Social History   Tobacco Use   Smoking status: Never   Smokeless tobacco: Never  Vaping Use   Vaping Use: Never used  Substance Use Topics   Alcohol use: No    Alcohol/week: 0.0 standard drinks of alcohol   Drug use: No     Allergies   Oxycodone-acetaminophen and Pilocarpine hcl   Review of Systems Review of Systems  Constitutional: Negative.   Respiratory: Negative.    Cardiovascular: Negative.   Skin:  Positive for rash. Negative for color change, pallor and wound.  Neurological: Negative.      Physical Exam Triage Vital Signs ED Triage Vitals  Enc Vitals Group     BP 09/26/21 1043 109/73  Pulse Rate 09/26/21 1043 82     Resp 09/26/21 1043 18     Temp 09/26/21 1043 97.7 F (36.5 C)     Temp Source 09/26/21 1043 Temporal     SpO2 09/26/21 1043 98 %     Weight --      Height --      Head Circumference --      Peak Flow --      Pain Score 09/26/21 1045 5     Pain Loc --      Pain Edu? --      Excl. in [place]? --    No data found.  Updated Vital Signs BP 109/73 (BP Location: Left Arm)   Pulse 82   Temp 97.7 F (36.5 C) (Temporal)   Resp 18   LMP 09/13/2021   SpO2 98%   Visual Acuity Right Eye Distance:   Left Eye Distance:   Bilateral Distance:    Right Eye Near:   Left Eye Near:    Bilateral Near:     Physical Exam Constitutional:      Appearance: Normal appearance.  HENT:     Head: Normocephalic.  Eyes:     Extraocular Movements: Extraocular movements intact.  Pulmonary:     Effort: Pulmonary effort is normal.  Skin:    Comments: Erythematous papular blisterlike rash present to the left flank in cluster formation  Neurological:     Mental Status: She is alert and oriented to person, place, and  time. Mental status is at baseline.  Psychiatric:        Mood and Affect: Mood normal.        Behavior: Behavior normal.      UC Treatments / Results  Labs (all labs ordered are listed, but only abnormal results are displayed) Labs Reviewed - No data to display  EKG   Radiology No results found.  Procedures Procedures (including critical care time)  Medications Ordered in UC Medications - No data to display  Initial Impression / Assessment and Plan / UC Course  I have reviewed the triage vital signs and the nursing notes.  Pertinent labs & imaging results that were available during my care of the patient were reviewed by me and considered in my medical decision making (see chart for details).  Herpes zoster without complication  Presentation is consistent with shingles, discussed with patient, valacyclovir prescribed and discussed administration, given written handout on blister care, may follow-up with urgent care as needed for concerns regarding healing Final Clinical Impressions(s) / UC Diagnoses   Final diagnoses:  Herpes zoster without complication     Discharge Instructions      Today you are being treated for herpes zoster,, the more common name  is shingles  Take valacyclovir 3 times daily (every 8 hours) for 7 days, this medicine will ideally reduce the amount of virus in your body helping the rash to clear faster and to reduce symptoms   Helping with itching and discomfort Put cold, wet cloths (cold compresses) on the area of the rash or blisters as told by your doctor. Cool baths can help you feel better. Try adding baking soda or dry oatmeal to the water to lessen itching. Do not bathe in hot water. Use calamine lotion as told by your doctor. Blister and rash care Keep your rash covered with a loose bandage (dressing). Wear loose clothing that does not rub on your rash. Wash your hands with soap and water for at least 20  seconds before and after you  change your bandage. If you cannot use soap and water, use hand sanitizer. Change your bandage as told by your doctor. Keep your rash and blisters clean. To do this, wash the area with mild soap and cool water as told by your doctor. Check your rash every day for signs of infection. Check for: More redness, swelling, or pain. Fluid or blood. Warmth. Pus or a bad smell. Do not scratch your rash. Do not pick at your blisters. To help you to not scratch: Keep your fingernails clean and cut short.   ED Prescriptions     Medication Sig Dispense Auth. Provider   valACYclovir (VALTREX) 1000 MG tablet Take 1 tablet (1,000 mg total) by mouth 3 (three) times daily. 21 tablet Kytzia Gienger, Leitha Schuller, NP      PDMP not reviewed this encounter.   Hans Eden, NP 09/26/21 1110

## 2021-09-26 NOTE — Discharge Instructions (Addendum)
Today you are being treated for herpes zoster,, the more common name  is shingles  Take valacyclovir 3 times daily (every 8 hours) for 7 days, this medicine will ideally reduce the amount of virus in your body helping the rash to clear faster and to reduce symptoms   Helping with itching and discomfort Put cold, wet cloths (cold compresses) on the area of the rash or blisters as told by your doctor. Cool baths can help you feel better. Try adding baking soda or dry oatmeal to the water to lessen itching. Do not bathe in hot water. Use calamine lotion as told by your doctor. Blister and rash care Keep your rash covered with a loose bandage (dressing). Wear loose clothing that does not rub on your rash. Wash your hands with soap and water for at least 20 seconds before and after you change your bandage. If you cannot use soap and water, use hand sanitizer. Change your bandage as told by your doctor. Keep your rash and blisters clean. To do this, wash the area with mild soap and cool water as told by your doctor. Check your rash every day for signs of infection. Check for: More redness, swelling, or pain. Fluid or blood. Warmth. Pus or a bad smell. Do not scratch your rash. Do not pick at your blisters. To help you to not scratch: Keep your fingernails clean and cut short.

## 2021-09-27 ENCOUNTER — Telehealth: Payer: Self-pay

## 2021-09-27 NOTE — Telephone Encounter (Signed)
Patient called UC reporting pain from rash that was diagnosed as shingles yesterday.  She is allergic to oxycodone.  Patient declines prescription for ibuprofen.  Instructed her to follow up with her PCP.

## 2022-03-25 ENCOUNTER — Ambulatory Visit
Admission: RE | Admit: 2022-03-25 | Discharge: 2022-03-25 | Disposition: A | Discharge status: Home / Self Care | Payer: BC Managed Care – PPO | Source: Ambulatory Visit | Attending: Urgent Care | Admitting: Urgent Care

## 2022-03-25 VITALS — BP 109/73 | HR 88 | Temp 99.3°F | Resp 16

## 2022-03-25 DIAGNOSIS — R6889 Other general symptoms and signs: Secondary | ICD-10-CM

## 2022-03-25 MED ORDER — PREDNISONE 20 MG PO TABS: ORAL_TABLET | ORAL | 0 refills | Status: AC

## 2022-03-25 NOTE — ED Provider Notes (Signed)
[facility]    CSN: [id number] Arrival date & time: 03/25/22  1358      History   Chief Complaint Chief Complaint  Patient presents with   Fatigue    Kids and husband have tested positive for Covid, I've ran fever since Sunday and am very  fatigued but negative for Covid. - Entered by patient    HPI Joann Fitzpatrick is a 32 y.o. female.   HPI  Patient presents to urgent care with symptoms of fatigue, headache, sore throat, fever x 5 days.  She states all other members of her family tested positive for COVID 4 to 5 days ago.  She has had multiple negative COVID tests at home.  History of fibromyalgia, IBS, chronic fatigue, B12 deficiency, vitamin D deficiency.  She has been treating fever with ibuprofen and states she has been able to keep temperature to 99.x.  She endorses sinus pressure and headache.  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, antepartum     Patient Active Problem List   Diagnosis Date Noted   Anxiety 09/03/2020   Dysmenorrhea 09/03/2020   History of anemia 09/03/2020   Irritable bowel syndrome 09/03/2020   Menorrhagia 09/03/2020   B12 deficiency 11/20/2018   ANA positive 10/30/2018   Chronic fatigue 10/30/2018   Keratoconjunctivitis sicca not due to Sjogren's syndrome 10/30/2018   Screening for osteoporosis 10/30/2018   SOB (shortness of breath) 03/17/2015    Past Surgical History:  Procedure Laterality Date   LAPAROSCOPY     TONSILLECTOMY     TUBAL LIGATION Bilateral 10/06/2015   Procedure: POST PARTUM TUBAL LIGATION;  Surgeon: Brayton Mars, MD;  Location: [facility];  Service: Gynecology;  Laterality: Bilateral;    OB History     Gravida  2   Para  2   Term   2   Preterm      AB      Living  2      SAB      IAB      Ectopic      Multiple  0   Live Births  2            Home Medications    Prior to Admission medications   Medication Sig Start Date End Date Taking? Authorizing Provider  ondansetron (ZOFRAN ODT) 4 MG disintegrating tablet Take 1 tablet (4 mg total) by mouth every 8 (eight) hours as needed for nausea or vomiting. 12/21/17   Rudene Re, MD  valACYclovir (VALTREX) 1000 MG tablet Take 1 tablet (1,000 mg total) by mouth 3 (three) times daily. 09/26/21   Hans Eden, NP    Family History Family History  Problem Relation Age of Onset   Renal cancer Other        aunt   Asthma Other        grandparent   Emphysema Other        grandparent   Thyroid nodules Other        grandparent    Social History Social History   Tobacco Use   Smoking status: Never   Smokeless tobacco: Never  Vaping Use  Vaping Use: Never used  Substance Use Topics   Alcohol use: No    Alcohol/week: 0.0 standard drinks of alcohol   Drug use: No     Allergies   Bee venom, Oxycodone-acetaminophen, Oxycodone-acetaminophen, and Pilocarpine hcl   Review of Systems Review of Systems   Physical Exam Triage Vital Signs ED Triage Vitals  Enc Vitals Group     BP 03/25/22 1425 109/73     Pulse Rate 03/25/22 1425 88     Resp 03/25/22 1425 16     Temp 03/25/22 1425 99.3 F (37.4 C)     Temp Source 03/25/22 1425 Oral     SpO2 03/25/22 1425 98 %     Weight --      Height --      Head Circumference --      Peak Flow --      Pain Score 03/25/22 1430 0     Pain Loc --      Pain Edu? --      Excl. in [place]? --    No data found.  Updated Vital Signs BP 109/73 (BP Location: Left Arm)   Pulse 88   Temp 99.3 F (37.4 C) (Oral)   Resp 16   SpO2 98%   Breastfeeding No   Visual Acuity Right Eye Distance:   Left Eye Distance:   Bilateral Distance:    Right Eye Near:   Left Eye Near:    Bilateral Near:      Physical Exam Vitals reviewed.  Constitutional:      Appearance: Normal appearance.  Skin:    General: Skin is warm and dry.  Neurological:     General: No focal deficit present.     Mental Status: She is alert and oriented to person, place, and time.  Psychiatric:        Mood and Affect: Mood normal.        Behavior: Behavior normal.      UC Treatments / Results  Labs (all labs ordered are listed, but only abnormal results are displayed) Labs Reviewed - No data to display  EKG   Radiology No results found.  Procedures Procedures (including critical care time)  Medications Ordered in UC Medications - No data to display  Initial Impression / Assessment and Plan / UC Course  I have reviewed the triage vital signs and the nursing notes.  Pertinent labs & imaging results that were available during my care of the patient were reviewed by me and considered in my medical decision making (see chart for details).   Patient is afebrile here without recent antipyretics. Satting well on room air. Overall is ill appearing, well hydrated, without respiratory distress.   Patient's symptoms are consistent with acute viral process, likely worsened by her chronic fibromyalgia and chronic fatigue syndrome.  Given her acute sinus symptoms, agreed to anti-inflammatory therapy with prednisone which she has tolerated many times in the past.  Final Clinical Impressions(s) / UC Diagnoses   Final diagnoses:  None   Discharge Instructions   None    ED Prescriptions   None    PDMP not reviewed this encounter.   Rose Phi, [place] 03/25/22 1443

## 2022-03-25 NOTE — ED Triage Notes (Signed)
Patient presents to UC for fatigue, HA, sore throat, and fever x 5 days. Family tested positive for covid. She has had negative covid tests. Taking ibuprofen.

## 2022-03-25 NOTE — Discharge Instructions (Signed)
Follow up here or with your primary care provider if your symptoms are worsening or not improving.     

## 2023-01-30 ENCOUNTER — Other Ambulatory Visit: Payer: Self-pay | Admitting: Unknown Physician Specialty

## 2023-01-30 DIAGNOSIS — H93293 Other abnormal auditory perceptions, bilateral: Secondary | ICD-10-CM

## 2023-02-22 ENCOUNTER — Other Ambulatory Visit: Payer: BC Managed Care – PPO
